# Patient Record
Sex: Female | Born: 1970 | Race: White | Hispanic: Yes | Marital: Married | Smoking: Never smoker
Health system: Southern US, Community
[De-identification: ages and names within clinical notes are randomized; demographics above are authoritative.]

## PROBLEM LIST (undated history)

## (undated) DIAGNOSIS — D693 Immune thrombocytopenic purpura: Secondary | ICD-10-CM

## (undated) DIAGNOSIS — D509 Iron deficiency anemia, unspecified: Secondary | ICD-10-CM

## (undated) DIAGNOSIS — E119 Type 2 diabetes mellitus without complications: Secondary | ICD-10-CM

## (undated) DIAGNOSIS — K219 Gastro-esophageal reflux disease without esophagitis: Secondary | ICD-10-CM

## (undated) DIAGNOSIS — E559 Vitamin D deficiency, unspecified: Secondary | ICD-10-CM

## (undated) HISTORY — DX: Type 2 diabetes mellitus without complications: E11.9

## (undated) HISTORY — DX: Immune thrombocytopenic purpura: D69.3

## (undated) HISTORY — DX: Iron deficiency anemia, unspecified: D50.9

## (undated) HISTORY — DX: Vitamin D deficiency, unspecified: E55.9

## (undated) HISTORY — DX: Gastro-esophageal reflux disease without esophagitis: K21.9

---

## 1998-10-15 ENCOUNTER — Ambulatory Visit (HOSPITAL_COMMUNITY): Admission: AD | Admit: 1998-10-15 | Discharge: 1998-10-15 | Payer: Self-pay | Admitting: Obstetrics & Gynecology

## 1998-11-17 ENCOUNTER — Ambulatory Visit (HOSPITAL_COMMUNITY): Admission: RE | Admit: 1998-11-17 | Discharge: 1998-11-17 | Payer: Self-pay | Admitting: *Deleted

## 1999-05-06 ENCOUNTER — Encounter: Payer: Self-pay | Admitting: Obstetrics & Gynecology

## 1999-05-06 ENCOUNTER — Inpatient Hospital Stay (HOSPITAL_COMMUNITY): Admission: AD | Admit: 1999-05-06 | Discharge: 1999-05-06 | Payer: Self-pay | Admitting: Obstetrics & Gynecology

## 1999-05-09 ENCOUNTER — Encounter: Payer: Self-pay | Admitting: Obstetrics

## 1999-05-09 ENCOUNTER — Encounter (INDEPENDENT_AMBULATORY_CARE_PROVIDER_SITE_OTHER): Payer: Self-pay | Admitting: Specialist

## 1999-05-09 ENCOUNTER — Inpatient Hospital Stay (HOSPITAL_COMMUNITY): Admission: AD | Admit: 1999-05-09 | Discharge: 1999-05-12 | Payer: Self-pay | Admitting: Obstetrics

## 2004-09-26 HISTORY — PX: TOTAL ABDOMINAL HYSTERECTOMY: SHX209

## 2005-02-09 ENCOUNTER — Inpatient Hospital Stay (HOSPITAL_COMMUNITY): Admission: RE | Admit: 2005-02-09 | Discharge: 2005-02-11 | Payer: Self-pay | Admitting: Gynecology

## 2005-02-09 ENCOUNTER — Encounter (INDEPENDENT_AMBULATORY_CARE_PROVIDER_SITE_OTHER): Payer: Self-pay | Admitting: *Deleted

## 2005-02-15 ENCOUNTER — Ambulatory Visit: Payer: Self-pay | Admitting: Oncology

## 2005-04-05 ENCOUNTER — Ambulatory Visit: Payer: Self-pay | Admitting: Oncology

## 2005-05-23 ENCOUNTER — Ambulatory Visit: Payer: Self-pay | Admitting: Oncology

## 2005-07-18 ENCOUNTER — Ambulatory Visit: Payer: Self-pay | Admitting: Oncology

## 2005-09-07 ENCOUNTER — Ambulatory Visit: Payer: Self-pay | Admitting: Oncology

## 2005-12-05 ENCOUNTER — Ambulatory Visit: Payer: Self-pay | Admitting: Oncology

## 2006-01-25 ENCOUNTER — Other Ambulatory Visit: Admission: RE | Admit: 2006-01-25 | Discharge: 2006-01-25 | Payer: Self-pay | Admitting: Gynecology

## 2006-01-27 ENCOUNTER — Ambulatory Visit: Payer: Self-pay | Admitting: Oncology

## 2006-01-31 LAB — CBC WITH DIFFERENTIAL/PLATELET
BASO%: 0.4 % (ref 0.0–2.0)
Basophils Absolute: 0 10*3/uL (ref 0.0–0.1)
EOS%: 0.6 % (ref 0.0–7.0)
HCT: 44.1 % (ref 34.8–46.6)
HGB: 15.2 g/dL (ref 11.6–15.9)
LYMPH%: 34.8 % (ref 14.0–48.0)
MCH: 30.3 pg (ref 26.0–34.0)
MCHC: 34.4 g/dL (ref 32.0–36.0)
MCV: 88 fL (ref 81.0–101.0)
MONO%: 7.1 % (ref 0.0–13.0)
NEUT%: 57.1 % (ref 39.6–76.8)
lymph#: 2.4 10*3/uL (ref 0.9–3.3)

## 2006-02-28 LAB — CBC WITH DIFFERENTIAL/PLATELET
BASO%: 0.7 % (ref 0.0–2.0)
Basophils Absolute: 0.1 10*3/uL (ref 0.0–0.1)
EOS%: 0.5 % (ref 0.0–7.0)
HCT: 44.7 % (ref 34.8–46.6)
HGB: 15.7 g/dL (ref 11.6–15.9)
LYMPH%: 34.8 % (ref 14.0–48.0)
MCH: 30.9 pg (ref 26.0–34.0)
MCHC: 35 g/dL (ref 32.0–36.0)
MONO#: 0.6 10*3/uL (ref 0.1–0.9)
NEUT%: 56.4 % (ref 39.6–76.8)
Platelets: 37 10*3/uL — ABNORMAL LOW (ref 145–400)

## 2006-05-24 ENCOUNTER — Ambulatory Visit: Payer: Self-pay | Admitting: Oncology

## 2006-05-26 LAB — CBC WITH DIFFERENTIAL/PLATELET
Eosinophils Absolute: 0.1 10*3/uL (ref 0.0–0.5)
HCT: 41.9 % (ref 34.8–46.6)
HGB: 14.6 g/dL (ref 11.6–15.9)
LYMPH%: 40.5 % (ref 14.0–48.0)
MONO#: 0.5 10*3/uL (ref 0.1–0.9)
NEUT#: 3.5 10*3/uL (ref 1.5–6.5)
NEUT%: 50.6 % (ref 39.6–76.8)
Platelets: 57 10*3/uL — ABNORMAL LOW (ref 145–400)
WBC: 6.8 10*3/uL (ref 3.9–10.0)
lymph#: 2.8 10*3/uL (ref 0.9–3.3)

## 2006-08-23 ENCOUNTER — Ambulatory Visit: Payer: Self-pay | Admitting: Oncology

## 2006-08-25 LAB — CBC WITH DIFFERENTIAL/PLATELET
Basophils Absolute: 0 10*3/uL (ref 0.0–0.1)
EOS%: 0.4 % (ref 0.0–7.0)
LYMPH%: 33.3 % (ref 14.0–48.0)
MCH: 31.3 pg (ref 26.0–34.0)
MCV: 89 fL (ref 81.0–101.0)
MONO%: 7 % (ref 0.0–13.0)
Platelets: 83 10*3/uL — ABNORMAL LOW (ref 145–400)
RBC: 4.95 10*6/uL (ref 3.70–5.32)
RDW: 13.1 % (ref 11.3–14.5)

## 2006-11-21 ENCOUNTER — Ambulatory Visit: Payer: Self-pay | Admitting: Oncology

## 2006-11-27 LAB — CBC WITH DIFFERENTIAL/PLATELET
BASO%: 0.5 % (ref 0.0–2.0)
Basophils Absolute: 0 10*3/uL (ref 0.0–0.1)
EOS%: 0.6 % (ref 0.0–7.0)
HGB: 14.8 g/dL (ref 11.6–15.9)
MCH: 31.3 pg (ref 26.0–34.0)
MCHC: 36.1 g/dL — ABNORMAL HIGH (ref 32.0–36.0)
MCV: 86.7 fL (ref 81.0–101.0)
MONO%: 6.6 % (ref 0.0–13.0)
RBC: 4.73 10*6/uL (ref 3.70–5.32)
RDW: 12.9 % (ref 11.3–14.5)
lymph#: 2.5 10*3/uL (ref 0.9–3.3)

## 2007-02-14 ENCOUNTER — Other Ambulatory Visit: Admission: RE | Admit: 2007-02-14 | Discharge: 2007-02-14 | Payer: Self-pay | Admitting: Gynecology

## 2007-02-21 ENCOUNTER — Ambulatory Visit: Payer: Self-pay | Admitting: Oncology

## 2007-02-23 LAB — CBC WITH DIFFERENTIAL/PLATELET
Basophils Absolute: 0 10*3/uL (ref 0.0–0.1)
Eosinophils Absolute: 0 10*3/uL (ref 0.0–0.5)
HGB: 14.5 g/dL (ref 11.6–15.9)
MCV: 87.2 fL (ref 81.0–101.0)
MONO%: 8.1 % (ref 0.0–13.0)
NEUT#: 3.1 10*3/uL (ref 1.5–6.5)
RBC: 4.64 10*6/uL (ref 3.70–5.32)
RDW: 12.8 % (ref 11.3–14.5)
WBC: 6.5 10*3/uL (ref 3.9–10.0)
lymph#: 2.8 10*3/uL (ref 0.9–3.3)

## 2007-02-27 ENCOUNTER — Ambulatory Visit (HOSPITAL_COMMUNITY): Admission: RE | Admit: 2007-02-27 | Discharge: 2007-02-27 | Payer: Self-pay | Admitting: Gynecology

## 2007-03-12 LAB — CBC WITH DIFFERENTIAL/PLATELET
Basophils Absolute: 0 10*3/uL (ref 0.0–0.1)
Eosinophils Absolute: 0.1 10*3/uL (ref 0.0–0.5)
HCT: 41.4 % (ref 34.8–46.6)
HGB: 14.8 g/dL (ref 11.6–15.9)
MONO#: 0.5 10*3/uL (ref 0.1–0.9)
NEUT%: 55.5 % (ref 39.6–76.8)
WBC: 7.5 10*3/uL (ref 3.9–10.0)
lymph#: 2.7 10*3/uL (ref 0.9–3.3)

## 2007-09-06 ENCOUNTER — Ambulatory Visit: Payer: Self-pay | Admitting: Oncology

## 2007-10-31 ENCOUNTER — Ambulatory Visit: Payer: Self-pay | Admitting: Oncology

## 2007-11-02 LAB — CBC WITH DIFFERENTIAL/PLATELET
Basophils Absolute: 0 10*3/uL (ref 0.0–0.1)
EOS%: 1.3 % (ref 0.0–7.0)
HCT: 43.5 % (ref 34.8–46.6)
HGB: 15.4 g/dL (ref 11.6–15.9)
MCH: 31.4 pg (ref 26.0–34.0)
MCV: 88.4 fL (ref 81.0–101.0)
MONO%: 7 % (ref 0.0–13.0)
NEUT%: 51.5 % (ref 39.6–76.8)
Platelets: 79 10*3/uL — ABNORMAL LOW (ref 145–400)

## 2008-03-21 ENCOUNTER — Other Ambulatory Visit: Admission: RE | Admit: 2008-03-21 | Discharge: 2008-03-21 | Payer: Self-pay | Admitting: Gynecology

## 2008-06-05 ENCOUNTER — Ambulatory Visit: Payer: Self-pay | Admitting: Oncology

## 2008-06-10 LAB — CBC WITH DIFFERENTIAL/PLATELET
Basophils Absolute: 0 10*3/uL (ref 0.0–0.1)
Eosinophils Absolute: 0 10*3/uL (ref 0.0–0.5)
HCT: 42.7 % (ref 34.8–46.6)
HGB: 15.2 g/dL (ref 11.6–15.9)
MCH: 31.9 pg (ref 26.0–34.0)
MONO#: 0.4 10*3/uL (ref 0.1–0.9)
NEUT%: 51.9 % (ref 39.6–76.8)
WBC: 5.8 10*3/uL (ref 3.9–10.0)
lymph#: 2.3 10*3/uL (ref 0.9–3.3)

## 2008-10-07 ENCOUNTER — Ambulatory Visit: Payer: Self-pay | Admitting: Oncology

## 2009-02-04 ENCOUNTER — Ambulatory Visit: Payer: Self-pay | Admitting: Oncology

## 2009-02-06 LAB — CBC WITH DIFFERENTIAL/PLATELET
BASO%: 0.9 % (ref 0.0–2.0)
HCT: 41.2 % (ref 34.8–46.6)
MCHC: 34.7 g/dL (ref 31.5–36.0)
MONO#: 0.4 10*3/uL (ref 0.1–0.9)
NEUT%: 52.3 % (ref 38.4–76.8)
WBC: 5.4 10*3/uL (ref 3.9–10.3)
lymph#: 2.1 10*3/uL (ref 0.9–3.3)

## 2009-03-27 ENCOUNTER — Ambulatory Visit: Payer: Self-pay | Admitting: Gynecology

## 2009-03-27 ENCOUNTER — Encounter: Payer: Self-pay | Admitting: Gynecology

## 2009-03-27 ENCOUNTER — Other Ambulatory Visit: Admission: RE | Admit: 2009-03-27 | Discharge: 2009-03-27 | Payer: Self-pay | Admitting: Gynecology

## 2009-07-30 ENCOUNTER — Ambulatory Visit: Payer: Self-pay | Admitting: Oncology

## 2010-02-03 ENCOUNTER — Ambulatory Visit: Payer: Self-pay | Admitting: Oncology

## 2010-02-04 LAB — CBC WITH DIFFERENTIAL/PLATELET
BASO%: 0.5 % (ref 0.0–2.0)
Basophils Absolute: 0 10*3/uL (ref 0.0–0.1)
EOS%: 0.5 % (ref 0.0–7.0)
Eosinophils Absolute: 0 10*3/uL (ref 0.0–0.5)
HCT: 42.7 % (ref 34.8–46.6)
HGB: 14.8 g/dL (ref 11.6–15.9)
LYMPH%: 39.6 % (ref 14.0–49.7)
MCH: 31.2 pg (ref 25.1–34.0)
MCHC: 34.6 g/dL (ref 31.5–36.0)
MCV: 90.2 fL (ref 79.5–101.0)
MONO#: 0.5 10*3/uL (ref 0.1–0.9)
MONO%: 8.6 % (ref 0.0–14.0)
NEUT#: 3 10*3/uL (ref 1.5–6.5)
NEUT%: 50.8 % (ref 38.4–76.8)
Platelets: 114 10*3/uL — ABNORMAL LOW (ref 145–400)
RBC: 4.73 10*6/uL (ref 3.70–5.45)
RDW: 12.6 % (ref 11.2–14.5)
WBC: 5.9 10*3/uL (ref 3.9–10.3)
lymph#: 2.3 10*3/uL (ref 0.9–3.3)

## 2010-06-03 ENCOUNTER — Ambulatory Visit: Payer: Self-pay | Admitting: Gynecology

## 2010-06-03 ENCOUNTER — Other Ambulatory Visit: Admission: RE | Admit: 2010-06-03 | Discharge: 2010-06-03 | Payer: Self-pay | Admitting: Gynecology

## 2010-08-03 ENCOUNTER — Ambulatory Visit: Payer: Self-pay | Admitting: Oncology

## 2011-02-03 ENCOUNTER — Other Ambulatory Visit: Payer: Self-pay | Admitting: Oncology

## 2011-02-03 ENCOUNTER — Encounter (HOSPITAL_BASED_OUTPATIENT_CLINIC_OR_DEPARTMENT_OTHER): Payer: Self-pay | Admitting: Oncology

## 2011-02-03 DIAGNOSIS — D693 Immune thrombocytopenic purpura: Secondary | ICD-10-CM

## 2011-02-03 LAB — CBC WITH DIFFERENTIAL/PLATELET
BASO%: 0.5 % (ref 0.0–2.0)
Basophils Absolute: 0 10*3/uL (ref 0.0–0.1)
EOS%: 0.6 % (ref 0.0–7.0)
Eosinophils Absolute: 0 10*3/uL (ref 0.0–0.5)
HCT: 40.9 % (ref 34.8–46.6)
HGB: 14.2 g/dL (ref 11.6–15.9)
LYMPH%: 32 % (ref 14.0–49.7)
MCH: 31.5 pg (ref 25.1–34.0)
MCHC: 34.7 g/dL (ref 31.5–36.0)
MCV: 90.8 fL (ref 79.5–101.0)
MONO#: 0.5 10*3/uL (ref 0.1–0.9)
MONO%: 9 % (ref 0.0–14.0)
NEUT#: 3.4 10*3/uL (ref 1.5–6.5)
NEUT%: 57.9 % (ref 38.4–76.8)
Platelets: 116 10*3/uL — ABNORMAL LOW (ref 145–400)
RBC: 4.5 10*6/uL (ref 3.70–5.45)
RDW: 13.3 % (ref 11.2–14.5)
WBC: 5.9 10*3/uL (ref 3.9–10.3)
lymph#: 1.9 10*3/uL (ref 0.9–3.3)

## 2011-02-11 NOTE — Op Note (Signed)
NAMEMATISON, NUCCIO           ACCOUNT NO.:  [id number]   MEDICAL RECORD NO.:  [id number]          PATIENT TYPE:  INP   LOCATION:  9313                          FACILITY:  [facility]   PHYSICIAN:  Juan H. Lily Peer, M.D.DATE OF BIRTH:  1971-04-07   DATE OF PROCEDURE:  02/09/2005  DATE OF DISCHARGE:                                 OPERATIVE REPORT   PREOPERATIVE DIAGNOSES:  Leiomyoma uteri.   POSTOPERATIVE DIAGNOSIS:  Leiomyoma uteri.   ANESTHESIA:  General.   PROCEDURES PERFORMED:  1.  Exploratory laparotomy.  2.  Lysis of pelvic adhesions.  3.  Total abdominal hysterectomy.  4.  Biopsy of the right ovary.   SURGEON:  Juan H. Lily Peer, M.D.   FIRST ASSISTANT:  Ivor Costa. Farrel Gobble, M.D.   INDICATION FOR OPERATION:  A 40 year old gravida 4, para 4, with a suspected  leiomyomatous uterus.   FINDINGS:  Patient with an apparent cervical leiomyoma and a small right  ovarian cyst that appeared to be more like an endometrioma.  Otherwise the  ovaries were normal, the tubes were normal, and no other abnormality of the  peritoneal surface was noted and the appendix was normal.   DESCRIPTION OF OPERATION:  After the patient was adequately counseled, she  was taken to the operating room, where she underwent a successful general  endotracheal anesthesia and she had received 1 g of Cefotetan for  prophylaxis.  She has PSA stockings for DVT prophylaxis.  The abdomen had  been prepped and draped in the usual sterile fashion, a Foley catheter had  been inserted in an effort to monitor urinary output.  A Pfannenstiel skin  incision was made approximately 2 cm above the symphysis pubis.  The  incision was carried down through the skin and subcutaneous tissue down to  the rectus fascia, whereby a midline nick was made.  The fascia was incised  in a transverse fashion, the peritoneal cavity was entered.  An Lenox Ahr retractor was used for exposure.  The patient was placed in a  slight Trendelenburg.  It was at this time that the large cervical myoma was  noted and the uterus was placed under traction and both triple pedicles  incorporating the utero-ovarian ligament and the tube and round ligament  were clamped proximal to the uterus to obtain traction.  The right round  ligament was identified and was suture ligated with 0 Vicryl suture and was  transected, and the anterior bladder flap was established.  The posterior  broad ligament was penetrated with the surgeon's finger after clearly  identifying the right ureter to be completely away, and a Heaney clamp was  utilized to clamp the utero-ovarian ligament as well as the round ligament  had been transected and was cut and suture ligated with 0 Vicryl suture,  followed by a transfixion stitch, thus by leaving the right tube and ovary  behind.  A similar procedure was carried out on the contralateral side.  The  broad ligament and cardinal ligaments were serially clamped, cut, and suture  ligated with 0 Vicryl suture.  Once the uterine arteries were clamped and  suture  ligated with 0 Vicryl suture, the leiomyoma was enucleated in an  effort to attain better exposure to the lower uterine segment.  Once this  was accomplished, the remainder of the cervix was clamped, cut, and suture  ligated with 0 Vicryl suture and the cervix was submitted off the operative  field, as was the uterus.  The angles were secured with 0 Vicryl in a  transfixion stitch, and the remaining pelvis was closed with an interrupted  suture of 0 Vicryl suture.  Three specimens were submitted.  The first one  was the leiomyoma, the second one was the uterus, the third one was the  remaining cervical stump.  The pelvic cavity was then copiously irrigated  with normal saline solution.  Both ovaries were suspended to the round  ligament (oophoropexy).  Attention was then placed to the right ovary.  It  had a small cyst.  There was question of whether  it was a dermoid.  It was  incised and the contents was submitted for histologic evaluation and a  figure-of-eight of 3-0 Vicryl suture was used to close the capsule.  Surgicel was placed in the vaginal cuff area on the raw surfaces after the  pelvic cavity was copiously irrigated with normal saline solution.  The  sponge count and needle count were correct.  The visceral peritoneum was  closed with a running stitch of 3-0 Vicryl suture, and the rectus fascia was  closed with a running stitch of 0 Vicryl suture, and the subcutaneous  bleeders were Bovie cauterized.  The skin was reapproximated with skin  clips, followed by placement of Xeroform gauze and a 4 x 8 dressing.  The  patient was extubated and transferred to the recovery room with stable vital  signs.  Blood loss for the procedure was 400 mL, urine output 250 mL, IV  fluids were 1500 mL of lactated Ringer's.  The specimen weighed 4 pounds 6  ounces.      JHF/MEDQ  D:  02/11/2005  T:  02/11/2005  Job:  [id number]

## 2011-02-11 NOTE — Discharge Summary (Signed)
Jenna Montoya, Jenna Montoya           ACCOUNT NO.:  [id number]   MEDICAL RECORD NO.:  [id number]          PATIENT TYPE:  INP   LOCATION:  9313                          FACILITY:  [facility]   PHYSICIAN:  Juan H. Lily Peer, M.D.DATE OF BIRTH:  [birth year]   DATE OF ADMISSION:  02/09/2005  DATE OF DISCHARGE:                                 DISCHARGE SUMMARY   Total days hospitalized:  2.   HISTORY:  The patient is a 40 year old gravida 4 para 4 that was taken to  the operating room the morning of Feb 11, 2005 secondary to a pelvic mass  suspicious for a leiomyomatous uteri. The patient underwent a total  abdominal hysterectomy and there was a right ovarian biopsy that was made,  and she had lysis of pelvic adhesions. The patient did well  intraoperatively. She had a blood loss of 400 mL. Of interest, her  preoperative hemoglobin and hematocrit were 13.8 and 40.8 and her platelet  count was __________. On her postoperative day #1 her hemoglobin was 8.9;  hematocrit 26.1; platelet count was 50,000. The patient had denied any  history of petechiae, any bleeding disorders of any sort in her or her  family, and we will make arrangements for her to follow up with a  hematologist/oncologist for further evaluation for possible underlying ITP.  The patient on postoperative day #1 had her Foley catheter removed as well  as her PCA pump. She was started on a clear liquid diet. By her  postoperative day #2 she was advanced to a full regular diet. She was  tolerating a regular diet well, was ambulating and showered, and was ready  to be discharged home and to be followed up to have her staples removed.   DIAGNOSES:  1.  Idiopathic thrombocytopenic purpura.  2.  Leiomyomatous uteri.  3.  Right ovarian cyst.  4.  Pelvic adhesions.   PROCEDURE PERFORMED:  1.  Exploratory laparotomy.  2.  Pelvic adhesiolysis.  3.  Total abdominal hysterectomy.  4.  Right ovarian biopsy.   FINAL DISPOSITION AND FOLLOW-UP:   The patient was discharged home on her  postoperative day #2. She was to return back to the office next week to have  her staples removed. She will be started on supplemental iron one p.o. daily  and was given a prescription for Lortab 7.5/500 to take one p.o. q.4-6h.  p.r.n. pain, and a prescription also for Reglan 10 mg to take one p.o. q.4-  6h. p.r.n. nausea. The patient will be given the scheduled appointment to  see  the hematologist/oncologist when she comes to the office next week to have  her staples removed. Discharge instructions were provided in written form  and were also discussed in Spanish. All questions were answered and will  follow accordingly.      JHF/MEDQ  D:  02/11/2005  T:  02/11/2005  Job:  [id number]

## 2011-02-11 NOTE — H&P (Signed)
Jenna Montoya, Jenna Montoya                ACCOUNT NO.:  [id number]   MEDICAL RECORD NO.:  [id number]           PATIENT TYPE:   LOCATION:                                 FACILITY:   PHYSICIAN:  Juan H. Lily Peer, M.D.     DATE OF BIRTH:   DATE OF ADMISSION:  02/09/2005  DATE OF DISCHARGE:                                HISTORY & PHYSICAL   CHIEF COMPLAINT:  Pelvic mass.   HISTORY:  The patient is a 40 year old, gravida 4, para 4, who had been  referred to our office on December 13, 2004, through courtesy of Dr. Thomasene Mohair in reference to incidental findings at the time of annual  examination.  The patient was found to have a pelvic mass.  The patient had  been complaining of lightheadedness at times, and anemia, and heavy periods.  An ultrasound had been done, at [facility], which demonstrated there  was a fundal fibroid measuring 9-cm slight to the right of the patient's  midline.  Both ovaries were noted to be normal as well as a normal  endometrial stripe based on the patient's age.  The patient had a CBC and  was determined to be anemic and was started on supplemental iron since her  hemoglobin was 11.3 and 35.3 respectively.  Anemia workup had demonstrated a  low serum iron as well as a low saturation, high total iron-binding  capacity.  On examination, it was confirmed the patient had a enlarged  uterus and the fundus where the fibroid went up the level underneath the  umbilicus.  The patient had decided to proceed with preventative surgery but  with ovarian conservation of her ongoing abdominal pressure, menorrhagia,  and iron-deficiency anemia.   PAST MEDICAL HISTORY:  1.  The patient has had three normal spontaneous vaginal deliveries.  2.  One cesarean section.  3.  Bilateral tubal sterilization procedure.   MEDICATIONS:  1.  Iron supplementation for anemia.  2.  Imitrex for her migraine headaches.   The patient denies any allergies.   FAMILY HISTORY:   Negative.   PHYSICAL EXAMINATION:  VITAL SIGNS:  The patient weighs 132 pounds.  Her  blood pressure was 102/78.  HEENT:  Unremarkable.  NECK:  Supple.  Trachea midline.  No carotid bruits.  No thyromegaly.  LUNGS:  Clear to auscultation without rhonchi or wheezes.  HEART:  Regular rate and rhythm without any murmurs or gallops.  BREAST:  Unremarkable.  ABDOMEN:  Soft nontender without rebound or guarding.  PELVIC:  Bartholin, urethral, and [name] glands within normal limits.  Vagina  and cervix with no gross lesions or discharge.  The uterus is slightly  irregular shape with a fundal __________  extending up to 2- to -3-cm below  the umbilicus.  The adnexa was unable to be assessed due to the size of the  fibroid.  It was confirmatory on rectal examination.   ASSESSMENT:  A 40 year old, gravida 4, para 4, with a large subserosal  leiomyoma causing the patient to suffer from iron-deficiency anemia as well  as bloating and pressure.  The patient was counseled as to risks, benefits, and pros and cons of  hysterectomy to include the following, the risk of hemorrhage and if she  were to receive blood or blood products she is fully aware of the potential  risks of anaphylactic reaction, hepatitis, and AIDs, also in the event of  trauma to internal organs need for corrective surgery at that time such as  blood vessels, bladder, intestines, or nerves were discussed, also there is  a risk of deep vein thrombosis and subsequent pulmonary embolism even death.  The patient will have PSA stockings to prevent such occurrence, and also the  risk of infection for which she will receive prophylaxis, IV antibiotics as  well.  All measures will be taken to leave the ovaries and she is 33-years-  of-age.  Unless __________  to remove one or both ovaries for some surgical  indication may need to be removed.  The patient is fully aware that she may  need to be placed on hormone replacement therapy.  All  these issues were  discussed with the patient.  All questions were answered in Spanish and we  will plan accordingly.   PLAN:  The patient is scheduled for a total abdominal hysterectomy on  Wednesday, Feb 09, 2005 at 8:30 a.m.      JHF/MEDQ  D:  02/08/2005  T:  02/08/2005  Job:  [id number]

## 2011-08-04 ENCOUNTER — Other Ambulatory Visit (HOSPITAL_BASED_OUTPATIENT_CLINIC_OR_DEPARTMENT_OTHER): Payer: Self-pay | Admitting: Lab

## 2011-08-04 ENCOUNTER — Other Ambulatory Visit: Payer: Self-pay | Admitting: Oncology

## 2011-08-04 DIAGNOSIS — D693 Immune thrombocytopenic purpura: Secondary | ICD-10-CM

## 2011-08-04 LAB — CBC WITH DIFFERENTIAL/PLATELET
Basophils Absolute: 0 10*3/uL (ref 0.0–0.1)
EOS%: 0.7 % (ref 0.0–7.0)
HCT: 41.7 % (ref 34.8–46.6)
HGB: 14.6 g/dL (ref 11.6–15.9)
MCH: 31.8 pg (ref 25.1–34.0)
MCV: 90.9 fL (ref 79.5–101.0)
MONO%: 9 % (ref 0.0–14.0)
NEUT%: 49.3 % (ref 38.4–76.8)
lymph#: 2.4 10*3/uL (ref 0.9–3.3)

## 2011-08-12 ENCOUNTER — Telehealth: Payer: Self-pay | Admitting: *Deleted

## 2011-08-12 NOTE — Telephone Encounter (Addendum)
Left message on voicemail for pt.  Message copied by Caleb Popp on Fri Aug 12, 2011  1:38 PM ------      Message from: Ladene Artist      Created: Fri Aug 12, 2011 10:09 AM       Labs are ok, please call patient

## 2011-08-30 ENCOUNTER — Encounter: Payer: Self-pay | Admitting: Gynecology

## 2011-08-30 ENCOUNTER — Ambulatory Visit (INDEPENDENT_AMBULATORY_CARE_PROVIDER_SITE_OTHER): Payer: Self-pay | Admitting: Gynecology

## 2011-08-30 ENCOUNTER — Other Ambulatory Visit (HOSPITAL_COMMUNITY)
Admission: RE | Admit: 2011-08-30 | Discharge: 2011-08-30 | Disposition: A | Discharge status: Home / Self Care | Payer: Self-pay | Source: Ambulatory Visit | Attending: Gynecology | Admitting: Gynecology

## 2011-08-30 VITALS — BP 118/70 | Ht 60.25 in | Wt 130.0 lb

## 2011-08-30 DIAGNOSIS — Z01419 Encounter for gynecological examination (general) (routine) without abnormal findings: Secondary | ICD-10-CM

## 2011-08-30 DIAGNOSIS — R635 Abnormal weight gain: Secondary | ICD-10-CM

## 2011-08-30 DIAGNOSIS — D693 Immune thrombocytopenic purpura: Secondary | ICD-10-CM | POA: Insufficient documentation

## 2011-08-30 NOTE — Progress Notes (Signed)
Jenna Montoya 28-May-1971 [id number]   History:    39 y.o.  for annual exam with no complaints today. Review of her record indicated her last mammogram was in 2008. She does her monthly self breast examination. Patient with history of colon, hysterectomy secondary to leiomyomatous uteri and 2006. Atypical leiomyoma with no evidence of increased mitotic activity was reported by the pathologist. 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 Irish Lack. (hematologist oncologist). Patient had CBC in his office November 8 with a normal platelet count 152,000.  Past medical history,surgical history, family history and social history were all reviewed and documented in the EPIC chart.  Gynecologic History Patient's last menstrual period was 02/09/2005. Contraception: Hysterectomy Last Pap: 2011. Results were:normal} Last mammogram: 2008. Results were:normal}  Obstetric History OB History    Grav Para Term Preterm Abortions TAB SAB Ect Mult Living   4 4 4       4      # Outc Date GA Lbr Len/2nd Wgt Sex Del Anes PTL Lv   1 TRM     M SVD  No Yes   2 TRM     F SVD  No Yes   3 TRM     M SVD  No Yes   4 TRM     F CS  No Yes       ROS:  Was performed and pertinent positives and negatives are included in the history.  Exam: chaperone present  BP 118/70  Ht 5' 0.25" (1.53 m)  Wt 130 lb (58.968 kg)  BMI 25.18 kg/m2  LMP 02/09/2005  Body mass index is 25.18 kg/(m^2).  General appearance : Well developed well nourished female. No acute distress HEENT: Neck supple, trachea midline, no carotid bruits, no thyroidmegaly Lungs: Clear to auscultation, no rhonchi or wheezes, or rib retractions  Heart: Regular rate and rhythm, no murmurs or gallops Breast:Examined in sitting and supine position were symmetrical in appearance, no palpable masses or tenderness,  no skin retraction, no nipple inversion, no nipple discharge, no skin discoloration, no axillary or supraclavicular  lymphadenopathy Abdomen: no palpable masses or tenderness, no rebound or guarding Extremities: no edema or skin discoloration or tenderness  Pelvic:  Bartholin, Urethra, [name] Glands: Within normal limits             Vagina: No gross lesions or discharge  Cervix:  absent Uterus absent  Adnexa  Without             masses or tenderness  Anus and perineum  normal   Rectovaginal  normal sphincter tone without palpated             masses or tenderness             Hemoccult not done     Assessment/Plan:  40 y.o. female for annual exam with no abnormalities detected. Patient has done well since her surgery 2006. Her ITP stable. CBC would not be drawn today since it was just drawn recently. We will check a fasting lipid profile along with a fasting blood sugar urinalysis and Pap smear. She was given a requisition to schedule her mammogram. She was encouraged to continue monthly self breast examination. She was instructed to continue to take her calcium and vitamin D for osteoporosis prevention. We'll see her back in one year or when necessary. Patient declined flu vaccine.    Ok Edwards MD, 9:16 AM 08/30/2011

## 2011-09-05 ENCOUNTER — Telehealth: Payer: Self-pay | Admitting: *Deleted

## 2011-09-05 ENCOUNTER — Other Ambulatory Visit: Payer: Self-pay | Admitting: *Deleted

## 2011-09-05 DIAGNOSIS — E782 Mixed hyperlipidemia: Secondary | ICD-10-CM

## 2011-09-05 NOTE — Telephone Encounter (Signed)
Message copied by Libby Maw on Mon Sep 05, 2011  3:29 PM ------      Message from: Bertram Savin A      Created: Mon Sep 05, 2011 11:39 AM       PATIENT HAS BEEN INFORMED OF MESSAGE BELOW.Marland Kitchen WILL ROUTE TO Sparkle [facility]ube TO DO REFERRAL.Marland KitchenMarland Kitchen

## 2011-09-05 NOTE — Telephone Encounter (Signed)
Patient will be contacted by [facility] CMA to inform patient of appointment set with Dr. Daleen Squibb on 10/05/11 @10 :30am.

## 2011-10-05 ENCOUNTER — Institutional Professional Consult (permissible substitution): Payer: Self-pay | Admitting: Cardiology

## 2011-11-01 ENCOUNTER — Other Ambulatory Visit: Payer: Self-pay | Admitting: Gynecology

## 2011-11-01 DIAGNOSIS — Z1231 Encounter for screening mammogram for malignant neoplasm of breast: Secondary | ICD-10-CM

## 2011-11-23 ENCOUNTER — Ambulatory Visit: Payer: Self-pay

## 2011-11-29 ENCOUNTER — Encounter: Payer: Self-pay | Admitting: Gynecology

## 2011-12-27 ENCOUNTER — Encounter: Payer: Self-pay | Admitting: Gynecology

## 2012-01-27 ENCOUNTER — Telehealth: Payer: Self-pay | Admitting: Oncology

## 2012-01-27 NOTE — Telephone Encounter (Signed)
S/w pt re appt for 5/24

## 2012-02-17 ENCOUNTER — Ambulatory Visit (HOSPITAL_BASED_OUTPATIENT_CLINIC_OR_DEPARTMENT_OTHER): Payer: Self-pay | Admitting: Oncology

## 2012-02-17 ENCOUNTER — Other Ambulatory Visit (HOSPITAL_BASED_OUTPATIENT_CLINIC_OR_DEPARTMENT_OTHER): Payer: Self-pay | Admitting: Lab

## 2012-02-17 ENCOUNTER — Telehealth: Payer: Self-pay | Admitting: Oncology

## 2012-02-17 VITALS — BP 115/77 | HR 64 | Temp 97.6°F | Ht 60.25 in | Wt 121.3 lb

## 2012-02-17 DIAGNOSIS — D693 Immune thrombocytopenic purpura: Secondary | ICD-10-CM

## 2012-02-17 LAB — CBC WITH DIFFERENTIAL/PLATELET
Basophils Absolute: 0 10*3/uL (ref 0.0–0.1)
Eosinophils Absolute: 0 10*3/uL (ref 0.0–0.5)
HCT: 41.6 % (ref 34.8–46.6)
HGB: 14.3 g/dL (ref 11.6–15.9)
LYMPH%: 38.8 % (ref 14.0–49.7)
MCV: 91.2 fL (ref 79.5–101.0)
MONO%: 7.6 % (ref 0.0–14.0)
NEUT#: 3.1 10*3/uL (ref 1.5–6.5)
NEUT%: 52.6 % (ref 38.4–76.8)
Platelets: 120 10*3/uL — ABNORMAL LOW (ref 145–400)

## 2012-02-17 NOTE — Progress Notes (Signed)
   [facility]    OFFICE PROGRESS NOTE   INTERVAL HISTORY:   She returns as scheduled. She reports easy bruising. No other bleeding.  There is a chronic "rash "at the upper arm. She has seen a dermatologist. Her daughter has a similar rash.  Objective:  Vital signs in last 24 hours:  Blood pressure 115/77, pulse 64, temperature 97.6 F (36.4 C), temperature source Oral, height 5' 0.25" (1.53 m), weight 121 lb 4.8 oz (55.021 kg), last menstrual period 02/09/2005.    HEENT: Oropharynx without thrush or bleeding Lymphatics: No cervical, supraclavicular, or axillary nodes Resp: Lungs clear bilaterally Cardio: Regular rate and rhythm GI: No hepatomegaly Vascular: No leg edema  Skin: Scattered small ecchymoses over the extremities in various stages of healing. Brown/Grady plaque-like slightly raised rash over the upper arm bilaterally    Lab Results:  Lab Results  Component Value Date   WBC 5.8 02/17/2012   HGB 14.3 02/17/2012   HCT 41.6 02/17/2012   MCV 91.2 02/17/2012   PLT 120* 02/17/2012   ANC 3.1 Platelets 152 on 08/04/2011, 116 on 02/03/2011    Medications: I have reviewed the patient's current medications.  Assessment/Plan: 1. Chronic ITP.  She has stable mild thrombocytopenia. 2. History of iron-deficiency anemia, resolved. 3. Skin rash at the upper arms-? Eczema, she will followup with her dermatologist as needed   Disposition:  She is stable from a hematologic standpoint. She knows to contact us for increased bruising or bleeding.  Ms. Jenna Montoya will return for a CBC in 6 months. She is scheduled for a one-year office visit.   Jenna Papas, MD  02/17/2012  2:28 PM

## 2012-02-17 NOTE — Telephone Encounter (Signed)
Gave pt appt calendar for November 2013 and see ML and lab in May 2014

## 2012-08-14 ENCOUNTER — Other Ambulatory Visit: Payer: Self-pay

## 2012-09-26 HISTORY — PX: UPPER GASTROINTESTINAL ENDOSCOPY: SHX188

## 2012-10-18 ENCOUNTER — Ambulatory Visit (INDEPENDENT_AMBULATORY_CARE_PROVIDER_SITE_OTHER): Payer: Self-pay | Admitting: Gynecology

## 2012-10-18 ENCOUNTER — Encounter: Payer: Self-pay | Admitting: Gynecology

## 2012-10-18 ENCOUNTER — Other Ambulatory Visit (HOSPITAL_COMMUNITY)
Admission: RE | Admit: 2012-10-18 | Discharge: 2012-10-18 | Disposition: A | Discharge status: Home / Self Care | Payer: Self-pay | Source: Ambulatory Visit | Attending: Gynecology | Admitting: Gynecology

## 2012-10-18 VITALS — BP 120/88 | Ht 59.75 in | Wt 123.0 lb

## 2012-10-18 DIAGNOSIS — D693 Immune thrombocytopenic purpura: Secondary | ICD-10-CM

## 2012-10-18 DIAGNOSIS — D649 Anemia, unspecified: Secondary | ICD-10-CM

## 2012-10-18 DIAGNOSIS — Z01419 Encounter for gynecological examination (general) (routine) without abnormal findings: Secondary | ICD-10-CM | POA: Insufficient documentation

## 2012-10-18 DIAGNOSIS — R1013 Epigastric pain: Secondary | ICD-10-CM

## 2012-10-18 DIAGNOSIS — Z1151 Encounter for screening for human papillomavirus (HPV): Secondary | ICD-10-CM | POA: Insufficient documentation

## 2012-10-18 LAB — CBC WITH DIFFERENTIAL/PLATELET
Basophils Absolute: 0 10*3/uL (ref 0.0–0.1)
Basophils Relative: 1 % (ref 0–1)
Eosinophils Relative: 1 % (ref 0–5)
HCT: 41.8 % (ref 36.0–46.0)
MCHC: 34.7 g/dL (ref 30.0–36.0)
MCV: 89.3 fL (ref 78.0–100.0)
Monocytes Absolute: 0.5 10*3/uL (ref 0.1–1.0)
Monocytes Relative: 7 % (ref 3–12)
RDW: 13.6 % (ref 11.5–15.5)

## 2012-10-18 NOTE — Patient Instructions (Addendum)
Clculos del conducto biliar comn  (Common Bile Duct Stones)  La vescula biliar se encuentra en el lado derecho de la parte superior del vientre (abdomen), debajo del hgado. [place] la bilis del hgado hasta que la necesite. La bilis se compone de colesterol, agua, sales, grasas, protenas, y producto de desecho (bilirubina). El conducto biliar comn es el tubo que transporta la bilis desde otros conductos hacia el intestino delgado para ayudar a Location manager las grasas. Los clculos del conducto biliar son piezas similares a piedra, que se forman en la vescula biliar y se alojan en el conducto biliar comn. Los clculos pueden estar en la vescula biliar y tambin en el conducto biliar. Pueden variar en tamao y nmero. Tener una piedra en el conducto biliar comn puede ser un problema grave. La piedra se puede eliminarse sin tratamiento. Sin embargo, si se [place] y provoca una obstruccin en el conducto biliar comn, la piel o los ojos pueden ponerse amarillos (ictericia). Si la obstruccin persiste, la bilis puede infectarse (colangitis). Esta es una enfermedad que pone en peligro la vida.  CAUSAS  El exceso de colesterol en la bilis es la causa ms frecuente para que la bilis se endurezca y forme un material similar a la piedra. El exceso de bilirrubina o no tener suficientes sales biliares tambin pueden hacer que la bilis se endurezca. No se sabe bien porqu esto ocurre.  Ciertos factores aumentan el riesgo de formar piedras, como:   Ser [place]. Las mujeres son dos veces ms propensas a Environmental education officer clculos biliares, especialmente aquellas que estn embarazadas, reciben terapia de reemplazo hormonal, o toman pldoras anticonceptivas.  Tener antecedentes familiares de clculos.  El exceso de peso (incluso un sobrepeso moderado).  El consumo de dieta rica en grasas y en colesterol.  Dieta rpidas o prdida de peso rpida.  Ser mayor de 60 aos.  Ser descendiente de indios  norteamericanos o mexicanos.  Tomar medicamentos para reducir Print production planner.  Tener diabetes. SNTOMAS  Los clculos pequeos en los conductos biliares pueden no causar ningn sntoma. A veces se descubren en el momento de hacer pruebas para otras enfermedades. Los clculos ms grandes pueden obstruir el flujo de la bilis hacia el intestino delgado. Esto puede causar infeccin e inflamacin en la vescula biliar, los conductos, o a veces, el hgado o el pncreas. Los sntomas (a veces llamados "ataque de la vescula biliar") pueden incluir:   Dolor en la zona del abdomen superior derecho (clico biliar). El dolor puede durar desde 30 minutos hasta varias horas.  Puede sentirse en los omplatos, en la espalda.  Dolor debajo del hombro derecho.  Nuseas.  Vmitos.  Grant Ruts.  [place]ct[place]. DIAGNSTICO  Los sntomas pueden imitar otras enfermedades, por lo tanto es importante realizar un diagnstico correcto. Generalmente se realizan diagnsticos por imgees para confirmar la sospecha y Production assistant, radio ubicacin y el tamao de las piedras, por ejemplo:   Regulatory affairs officer.  Tomografa computada.  Colecistogammagrafa, o HIDA (cido iminodiactico hepatobiliar). Esta es una tcnica por imgenes que utiliza una tintura radiactiva segura.  CPRE (colangiopancreatografia retrgrada endoscpica). Se trata de una tcnica por imgenes que puede detectar y eliminar las piedras. Es posible que se realicen anlisis de [place].  TRATAMIENTO  Si no tiene sntomas, se har una observacin. Si usted experimentando dolor u otros sntomas:   Le recetarn analgsicos y antibiticos.  La CPRE se puede realizar para Medical laboratory scientific officer y Pharmacologist las piedras para Technical sales engineer obstruccin. En este procedimiento, un tubo se inserta a travs de la  boca, hacia abajo en el intestino delgado. Despus de la CPRE, la vescula biliar con frecuencia se retira (colecistectoma), durante la misma hospitalizacin. La colecistectoma se  realiza tpicamente con un flexible instrumento parecido al telescopio (laparoscopio) a travs de pequeas incisiones.  En raras ocasiones, algunos casos pueden requerir [place], con una incisin ms grande. La ciruga abierta es necesaria cuando la CPRE no tiene xito para quitar la piedra o si la ciruga laparoscpica no tiene xito para extirpar la vescula.  La insercin de un tubo de drenaje biliar (colecistectoma) se puede realizar para cuidar de clculos del conducto biliar comn. En este procedimiento, se inserta un tubo en la vescula biliar o los conductos biliares en el hgado para aliviar la presin causada por la piedra. Despus de que el paciente mejore, la vescula biliar ser eliminado y la piedra tratada. PREVENCIN   Limite el consumo de grasas y colesterol.  Evite las dietas mgicas o la prdida rpida de [place].  Mantenga un peso saludable. INSTRUCCIONES PARA EL CUIDADO EN EL HOGAR   Tome la medicacin para Primary school teacher como se le indic.  Limite el consumo de grasas y colesterol si siente dolor o hinchazn. Trate de hacer comidas pequeas.  Concurra a las visitas de control con el mdico para Wellsite geologist, segn las indicaciones.  Si le han realizado un procedimiento o una ciruga, siga las indicaciones especricas del mdico para los cuidados posteriores. SOLICITE ATENCIN MDICA SI:  Los ataques son ms frecuentes e impactan en sus actividades diarias.  SOLICITE ATENCIN MDICA DE INMEDIATO SI:   El dolor no desaparece o se agrava (dura hasta 5 horas).  Tiene fiebre.  Siente nuseas.  Tiene vmitos.  Parece tener ictericia.  Las heces son de Training and development officer rojizo. ASEGRESE DE QUE:   Comprende estas instrucciones.  Controlar su enfermedad.  Solicitar ayuda de inmediato si no mejora o si empeora. Document Released: 05/25/2011 Document Revised: 12/05/2011 [facility] Patient Information 2013 [place], [place].  Enfermedad por  Helicobacter Pylori y lcera (Helicobacter Pylori and Ulcer Disease) Es posible que exista una lcera en su estmago (lcera gstrica) o en la primera parte del intestino delgado, lo que se denomina el duodeno (lcera duodenal). Una lcera es una ruptura en el recubrimiento del estmago o del duodeno. La ruptura avanza hacia el tejido ms profundo. El Helicobacter pylori (H. Pylori) es un tipo de germen (bacteria) responsable de la mayora de las lceras gstricas o duodenales. CAUSAS  Un germen (bacteria). El H. pylori puede debilitar la mucosa protectora que cubre el estmago y [place]. Esto permite que ingrese cido en el recubrimiento sensible del estmago o duodeno y entonces puede formarse una lcera.  Ciertos medicamentos.  Utilizan sustancias que puedan irritar el recubrimiento del estmago (alcohol, tabaco, o medicamentos tales como Advil o Motrin) en la presencia de una infeccin por H. pylori. Esto puede aumentar las probabilidades de tener una lcera.  Cncer (poco frecuente). La mayor parte de las personas infectadas con H. pylori no tienen lceras. No se sabe de que [employer] se contagian el H. pylori. Podra ser a travs de alimentos o del agua. El H. pylori se ha hallado en la saliva de algunas personas infectadas. Por lo tanto, es posible que la bacteria tambin se contagie a travs del contacto boca a boca, tal como al besar. SNTOMAS Los problemas (sntomas) de las lceras normalmente son:  Ardor persistente en la parte superior del vientre (abdomen). A menudo esto empeora si se  tiene el estmago vaco. Puede mejorar consumiendo alimentos. Puede estar asociado con sentir ganas de vomitar (nuseas), hinchazn y vmitos.  Si la lcera resulta en sangrado, puede causar:  Materia fecal de color negro alquitranado.  Vmitos de sangre roja brillante  Vmito de una sustancia similar a la borra del caf. Si la hemorragia es grave, puede haber prdida de la conciencia y  shock. Adems de lceras, el H. pylori tambin puede causar gastritis crnica (irritacin del recubrimiento del estmago, sin lcera) o un Programme researcher, broadcasting/film/video cido. Es posible que no tenga sntomas aunque tenga una infeccin por H. pylori. Aunque se trata de una infeccin, es posible que no tenga los sntomas comunes de una infeccin (tal como la [place]). DIAGNSTICO Las lceras se pueden diagnosticar de Environmental consultant. Si tiene [place], es importante que sepa si fue causada o no por H. pylori. El tratamiento para una lcera causada por el H. pylori es diferente al tratamiento para una lcera producida por otras causas. La mejor forma de Engineer, manufacturing H. pylori es obtener tejido directamente de la lcera durante un examen endoscpico.   Neomia Dear endoscopa es un examen en el que se utiliza un endoscopio. Un endoscopio es un tubo fino que [place] y tiene una pequea cmara en el extremo. Es como un telescopio flexible. Se le dan drogas al paciente para qu est ms calmo (sedante). El profesional introduce el endoscopio por la boca, y lo hace bajar hacia el estmago y [place]. Esto le permite al mdico observar el tejido que cubre el esfago, el [employer] y [place].  Si no es necesario realizar una endoscopa, entonces l H. pylori puede detectarse con exmenes de sangre, de materia fecal, o incluso de aliento. TRATAMIENTO  El tratamiento de una lcera pptica por H. pylori normalmente incluye una combinacin de:  Medicamentos que destruyen grmenes (antibiticos).  cido-supresores.  Protectores de [employer] and [employer]ity [employer].  No se recomienda el uso de un solo medicamento para tratar el H. Pylori. La forma ms eficaz de tratar el problema consiste en administrar durante 2 semanas lo que se conoce como terapia triple. Esta incluye el uso de dos antibiticos para Wellsite geologist las bacterias y un supresor de la secrecin de cido, o un protector del revestimiento gstrico. La terapia triple administrada durante dos  semanas reduce los sntomas de la [place], destruye las bacterias y previene que se vuelvan a formar lceras en muchos pacientes.  Desafortunadamente, a las Optometrist complicado porque exige tomar hasta 20 pastillas al [employer], los antibiticos que se utilizan para la terapia triple pueden causar leves efectos secundarios. [address] se incluyen nuseas, vmitos, diarrea, heces de color oscuro, sabor metlico, [place], dolores de [place] e infecciones por levaduras en las mujeres. Consulte con el profesional que lo asiste si tiene alguno de [employer]. INSTRUCCIONES PARA EL CUIDADO DOMICILIARIO  Tome los medicamentos segn las indicaciones y por todo el tiempo en que se los hayan prescripto. Comuniquese con profesional que lo asiste si tiene problemas o sufre efectos adversos debido a los medicamentos.  Contine con [place] y actividades habituales a menos que el profesional que lo asiste le aconseje otra cosa.  Evite el tabaco, el alcohol, y la cafena. El tabaco disminuir la velocidad de curacin.  Evite los medicamentos que puedan ser nocivos. Entre ellos se incluye la aspirina y los AINES como el ibuprofeno y el naproxeno.  Evite aquellos elementos que Naval architect su estado o Forensic scientist.  Hay disponibles muchos medicamentos de venta  libre con los que se puede controlar el cido estomacal y otros sntomas. Converse con el profesional que lo asiste antes de utilizarlos. No cambie los medicamentos de prescripcin por medicamentos de venta libre sin hablarlo con el profesional.  Generalmente no es necesario llevar dietas especiales.  Cumpla con las citas y exmenes de sangre tal como se le indic. SOLICITE ATENCIN MDICA SI:  El dolor u otros sntomas de la lcera no mejoran luego de [address] de iniciado el [place].  Presenta diarrea. Este problema puede estar relacionado con algunos tratamientos.  Tiene indigestin o acidez gstrica continua  an cuando los principales sntomas de la lcera hayan mejorado.  Cree que tiene cualquier efecto secundario de los medicamentos o si no comprende cmo Chemical engineer sus medicamentos correctamente. SOLICITE ATENCIN MDICA DE INMEDIATO SI: Reece Agar lo siguiente:  Desarrolla una hemorragia rectal con sangre de color rojo brillante.  Tiene deposiciones de color negro alquitranado.  Vomita sangre.  Se siente mareado, dbil, tiene episodios de [place], [place] y siente fro.  Experimenta un dolor abdominal intenso que no puede controlar con los [employer]. No tome medicamentos para el dolor a menos que se lo haya indicado el profesional que lo asiste. EST SEGURO QUE:   Comprende las instrucciones para el alta mdica.  Controlar su enfermedad.  Solicitar atencin mdica de inmediato segn las indicaciones. Document Released: 09/12/2005 Document Revised: 12/05/2011 [facility] Patient Information 2013 [place], [place]. lcera pptica (Peptic Ulcers) Las lceras son pequeos crteres o llagas abiertas que se producen en el tejido que cubre el estmago o el duodeno (la primera porcin del intestino delgado). El trmino lcera pptica se [place] para describir ambos tipos de Occupational hygienist. Existen varios tipos de tratamiento que [employer] molestias que ocasionan las lceras. En la mayor parte de los casos las lceras se curan. CAUSAS Y CARACTERSTICAS COMUNES DE LAS LCERAS PPTICAS Las lceras ppticas se producen slo en aquellas reas del aparato digestivo que entran en contacto con los jugos gstricos. Estos jugos se segregan (producen) Higher education careers adviser. Incluyen un cido y [place] enzima denominada pepsina, que tiene la funcin de digerir las protenas. Muchas personas que sufren lcera duodenal tienen demasiada secrecin de jugos gstricos en el estmago. La mayor parte de las personas con lceras gstricas (en el estmago) tienen cantidades normales o por debajo de lo normal de cido [employer].  Algunas veces, la membrana mucosa (el tejido protector) del estmago y el duodeno no cumple bien su funcin; esto puede ser un factor que facilita el desarrollo de las lceras. Las lceras duodenales generalmente ocasionan dolor en una regin pequea entre el esternn y el ombligo. El dolor vara desde el dolor por hambre hasta una constante sensacin (percepcin) lacerante o ardiente. Algunas veces el dolor se siente durante el sueo y puede despertar a la persona durante la noche. Con frecuencia el dolor se produce dos o tres horas despus de comer, cuando el estmago est vaco. Otros sntomas (problemas) comunes incluyen la ingestin excesiva de alimentos para Engineer, materials. El comer alivia el dolor de una lcera duodenal. El dolor de la lcera gstrica puede sentirse en los mismos lugares que el dolor por lcera duodenal, o ligeramente ms [place]. Tambin puede haber una sensacin de sentirse lleno, de indigestin y [place]. Algunas veces el dolor se produce cuando el estmago est lleno. Esto ocasiona prdida del apetito seguida de prdida de peso. INSTRUCCIONES PARA EL CUIDADO DOMICILIARIO  Se ha comprobado que el consumo de tabaco entorpece la curacin de una lcera.  DEJE DE FUMAR.  Evite el alcohol, la aspirina y otros medicamentos antiinflamatorios (que alivian la hinchazn y la irritacin). Estas sustancias debilitan el tejido que Doctor, hospital.  Consuma alimentos nutritivos y a intervalos regulares.  Evite los alimentos que le hagan mal.  Tome los medicamentos y los anticidos como se le indic. Hay medicamentos de venta libre para Futures trader. Los medicamentos recetados reducen la secrecin de cido, bloquean su produccin o proporcionan una cubierta protectora sobre la [place]. Si le han recetado un anticido especfico, no intercambie las marcas sin la aprobacin del profesional que lo asiste. Generalmente la ciruga no es necesaria. La dieta y/o tratamiento con  medicamentos son eficaces. La ciruga es necesaria si hubo una perforacin, obstruccin debido a Physiological scientist cicatriz o una hemorragia incontrolable, o si hay un dolor muy intenso que no puede controlarse de [place]. SOLICITE ATENCIN MDICA DE INMEDIATO SI:  Observa signos de hemorragia. Aqu se incluye el vomitar sangre fresca de color rojo brillante o evacuar materia fecal de aspecto negro alquitranado.  Si siente debilidad, fatiga o pierde el conocimiento. Estos sntomas pueden ser el resultado de una hemorragia (sangrado) grave. La consecuencia puede ser un estado de shock.  Si tiene dolor abdominal (en el vientre) sbito e intenso. ste es Financial risk analyst signo de perforacin. Si esto ocurre ser necesario un tratamiento quirrgico inmediato.  Si siente un dolor intenso y presenta vmitos continuos. Esto puede indicar una obstruccin del tracto digestivo. Document Released: 06/22/2005 Document Revised: 12/05/2011 [facility] Patient Information 2013 [place], [place].

## 2012-10-18 NOTE — Progress Notes (Signed)
Jenna Montoya [birth year] [id number]   History:    42 y.o.   is an 42 y.o. female. Who presented for her annual gynecological examination had voiced for several months she has been experiencing midepigastric discomfort. She is not an urgent care and I prescribed her Prilosec and stated that it has helped her some. She states that the symptoms are triggered with meals. She states that she belches a lot has metallic taste in her mouth. She denies any hematochezia.  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 Irish Lack. (hematologist oncologist). She is getting her CBCs every 6 months and seen him once a year.  Patient had a normal Pap smear in 2012. She denied any prior history of abnormal Pap smear. Her last mammogram was in October 2013 which was normal. Patient does her monthly self breast examination.   Past medical history,surgical history, family history and social history were all reviewed and documented in the EPIC chart.  Gynecologic History Patient's last menstrual period was 02/09/2005. Contraception: status post hysterectomy Last Pap: 2012. Results were: normal Last mammogram: 2012. Results were: normal  Obstetric History OB History    Grav Para Term Preterm Abortions TAB SAB Ect Mult Living   4 4 4       4      # Outc Date GA Lbr Len/2nd Wgt Sex Del Anes PTL Lv   1 TRM     M SVD  No Yes   2 TRM     F SVD  No Yes   3 TRM     M SVD  No Yes   4 TRM     F CS  No Yes       ROS: A ROS was performed and pertinent positives and negatives are included in the history.  GENERAL: No fevers or chills. HEENT: No change in vision, no earache, sore throat or sinus congestion. NECK: No pain or stiffness. CARDIOVASCULAR: No chest pain or pressure. No palpitations. PULMONARY:  No shortness of breath, cough or wheeze. GASTROINTESTINAL: Midepigastric pains, nausea, vomiting or diarrhea, melena or bright red blood per rectum. GENITOURINARY: No urinary frequency, urgency, hesitancy or dysuria. MUSCULOSKELETAL: No joint or muscle pain, no back pain, no recent trauma. DERMATOLOGIC: No rash, no itching, no lesions. ENDOCRINE: No polyuria, polydipsia, no heat or cold intolerance. No recent change in weight. HEMATOLOGICAL: No anemia or easy bruising or bleeding. NEUROLOGIC: No headache, seizures, numbness, tingling or weakness. PSYCHIATRIC: No depression, no loss of interest in normal activity or change in sleep pattern.     Exam: chaperone present  BP 120/88  Ht 4' 11.75" (1.518 m)  Wt 123 lb (55.792 kg)  BMI 24.22 kg/m2  LMP 02/09/2005  Body mass index is 24.22 kg/(m^2).  General appearance : Well developed well nourished female. No acute distress HEENT: Neck supple, trachea midline, no carotid bruits, no thyroidmegaly Lungs: Clear to auscultation, no rhonchi or wheezes, or rib retractions  Heart: Regular rate and rhythm, no murmurs or gallops Breast:Examined in sitting and supine position were symmetrical in appearance, no palpable masses or tenderness,  no skin retraction, no nipple inversion, no nipple discharge, no skin discoloration, no axillary or supraclavicular lymphadenopathy Abdomen: no palpable masses tenderness was elicited midepigastric region Extremities: no edema or skin discoloration or tenderness  Pelvic:  [place], Urethra,  [name] Glands: Within normal limits             Vagina: No gross lesions or discharge  Cervix: No gross lesions or discharge  Uterus  Absent  Adnexa  Without masses or tenderness  Anus and perineum  normal   Rectovaginal  normal sphincter tone without palpated masses or tenderness             Hemoccult not indicated     Assessment/Plan:  42 y.o. female for annual exam with midepigastric discomfort especially associated with  meals makes one suspicious of cholelithiasis. Also her symptoms could be attributed to H. pylori for which we will test today. Also there is the possibility of gastric ulcer. Patient wants to wait on the ultrasound and wait for the results of the H. pylori. We'll schedule the ultrasound at a later date  and possibly refer her to a general surgeon if indeed cholelithiasis is noted or a GI consultation if upper abdominal ultrasound was negative. The following labs were today : CBC, screening cholesterol, H. pylori, urinalysis and Pap smear. Patient was reminded that she needs her mammogram next month. We discussed importance of monthly self breast examination. All the above was explained in Spanish and literature information was provided. Patient not interested in flu vaccine.    Ok Edwards MD, 5:42 PM 10/18/2012

## 2012-10-19 ENCOUNTER — Encounter: Payer: Self-pay | Admitting: Gynecology

## 2012-10-19 LAB — URINALYSIS W MICROSCOPIC + REFLEX CULTURE
Casts: NONE SEEN
Crystals: NONE SEEN
Glucose, UA: NEGATIVE mg/dL
Hgb urine dipstick: NEGATIVE
Ketones, ur: NEGATIVE mg/dL
Leukocytes, UA: NEGATIVE
Specific Gravity, Urine: 1.024 (ref 1.005–1.030)
pH: 5.5 (ref 5.0–8.0)

## 2013-02-15 ENCOUNTER — Other Ambulatory Visit (HOSPITAL_BASED_OUTPATIENT_CLINIC_OR_DEPARTMENT_OTHER): Payer: No Typology Code available for payment source | Admitting: Lab

## 2013-02-15 ENCOUNTER — Telehealth: Payer: Self-pay | Admitting: Oncology

## 2013-02-15 ENCOUNTER — Ambulatory Visit (HOSPITAL_BASED_OUTPATIENT_CLINIC_OR_DEPARTMENT_OTHER): Payer: No Typology Code available for payment source | Admitting: Nurse Practitioner

## 2013-02-15 VITALS — BP 118/66 | HR 56 | Temp 98.4°F | Resp 18 | Ht 59.0 in | Wt 120.3 lb

## 2013-02-15 DIAGNOSIS — D693 Immune thrombocytopenic purpura: Secondary | ICD-10-CM

## 2013-02-15 LAB — CBC WITH DIFFERENTIAL/PLATELET
Basophils Absolute: 0 10*3/uL (ref 0.0–0.1)
Eosinophils Absolute: 0 10*3/uL (ref 0.0–0.5)
HCT: 42.3 % (ref 34.8–46.6)
HGB: 14.2 g/dL (ref 11.6–15.9)
MCH: 30.9 pg (ref 25.1–34.0)
MONO#: 0.4 10*3/uL (ref 0.1–0.9)
NEUT#: 2.9 10*3/uL (ref 1.5–6.5)
NEUT%: 49.4 % (ref 38.4–76.8)
RDW: 13.5 % (ref 11.2–14.5)
lymph#: 2.5 10*3/uL (ref 0.9–3.3)

## 2013-02-15 NOTE — Telephone Encounter (Signed)
gv pt appt schedule for November 2014 and May 2015.  °

## 2013-02-15 NOTE — Progress Notes (Signed)
OFFICE PROGRESS NOTE  Interval history:  Jenna Montoya returns as scheduled. She feels well. She denies bleeding. She continues to note easy bruising. Earlier this year she was experiencing some abdominal pain. She was prescribed some "pills". The pain has resolved.   Objective: Blood pressure 118/66, pulse 56, temperature 98.4 F (36.9 C), temperature source Oral, resp. rate 18, height 4\' 11"  (1.499 m), weight 120 lb 4.8 oz (54.568 kg), last menstrual period 02/09/2005.  No thrush or ulcerations. No palpable cervical, supraclavicular or axillary lymph nodes. Lungs are clear. Regular cardiac rhythm. Abdomen is soft and nontender. No hepatomegaly. Extremities are without edema.  Lab Results: Lab Results  Component Value Date   WBC 5.9 02/15/2013   HGB 14.2 02/15/2013   HCT 42.3 02/15/2013   MCV 92.2 02/15/2013   PLT 151 02/15/2013    Chemistry:    Chemistry   No results found for this basename: NA, K, CL, CO2, BUN, CREATININE, GLU   No results found for this basename: CALCIUM, ALKPHOS, AST, ALT, BILITOT       Studies/Results: No results found.  Medications: I have reviewed the patient's current medications.  Assessment/Plan:  1. Chronic ITP.  2. History of iron-deficiency anemia, resolved.  Disposition-she remains stable from a hematologic standpoint. She will return for a CBC in 6 months and a followup visit in one year. She will contact the office in the interim with increased bruising or bleeding.  Plan reviewed with Dr. Derenda Fennel, Misty Stanley ANP/GNP-BC

## 2013-04-02 ENCOUNTER — Encounter: Payer: Self-pay | Admitting: Gynecology

## 2013-08-19 ENCOUNTER — Other Ambulatory Visit: Payer: No Typology Code available for payment source

## 2013-12-17 ENCOUNTER — Ambulatory Visit (INDEPENDENT_AMBULATORY_CARE_PROVIDER_SITE_OTHER): Payer: BC Managed Care – PPO | Admitting: Gynecology

## 2013-12-17 ENCOUNTER — Encounter: Payer: Self-pay | Admitting: Gynecology

## 2013-12-17 VITALS — BP 120/78 | Ht 60.0 in | Wt 125.0 lb

## 2013-12-17 DIAGNOSIS — Z01419 Encounter for gynecological examination (general) (routine) without abnormal findings: Secondary | ICD-10-CM

## 2013-12-17 DIAGNOSIS — K2990 Gastroduodenitis, unspecified, without bleeding: Secondary | ICD-10-CM

## 2013-12-17 DIAGNOSIS — K2970 Gastritis, unspecified, without bleeding: Secondary | ICD-10-CM

## 2013-12-17 DIAGNOSIS — D693 Immune thrombocytopenic purpura: Secondary | ICD-10-CM

## 2013-12-17 DIAGNOSIS — Z23 Encounter for immunization: Secondary | ICD-10-CM

## 2013-12-17 NOTE — Progress Notes (Signed)
Charlton HawsFilomena Blok 09/03/1971 [id number]   History:    43 y.o.  for annual gyn exam with no major complaints today. Patient last year was evaluated for midepigastric pain was tested for H. pylori and was negative. [facility] who had done biopsies but we do not have the result and she is currently on Prilosec.  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 Truett PernaSherrill (hematologist oncologist). She's gave her CBC every 6 months and sees him once a year. She is scheduled for followup in 2 months.  Patient with no prior history of abnormal Pap smears.   Past medical history,surgical history, family history and social history were all reviewed and documented in the EPIC chart.  Gynecologic History Patient's last menstrual period was 02/09/2005. Contraception: status post hysterectomy Last Pap: 2014. Results were: normal Last mammogram: 2014. Results were: normal  Obstetric History OB History  Gravida Para Term Preterm AB SAB TAB Ectopic Multiple Living  4 4 4       4     # Outcome Date GA Lbr Len/2nd Weight Sex Delivery Anes PTL Lv  4 TRM     F CS  N Y  3 TRM     M SVD  N Y  2 TRM     F SVD  N Y  1 TRM     M SVD  N Y       ROS: A ROS was performed and pertinent positives and negatives are included in the history.  GENERAL: No fevers or chills. HEENT: No change in vision, no earache, sore throat or sinus congestion. NECK: No pain or stiffness. CARDIOVASCULAR: No chest pain or pressure. No palpitations. PULMONARY: No shortness of breath, cough or wheeze. GASTROINTESTINAL: No abdominal pain, nausea, vomiting or diarrhea, melena or bright red blood per rectum. GENITOURINARY: No urinary frequency, urgency, hesitancy or  dysuria. MUSCULOSKELETAL: No joint or muscle pain, no back pain, no recent trauma. DERMATOLOGIC: No rash, no itching, no lesions. ENDOCRINE: No polyuria, polydipsia, no heat or cold intolerance. No recent change in weight. HEMATOLOGICAL: No anemia or easy bruising or bleeding. NEUROLOGIC: No headache, seizures, numbness, tingling or weakness. PSYCHIATRIC: No depression, no loss of interest in normal activity or change in sleep pattern.     Exam: chaperone present  BP 120/78  Ht 5' (1.524 m)  Wt 125 lb (56.7 kg)  BMI 24.41 kg/m2  LMP 02/09/2005  Body mass index is 24.41 kg/(m^2).  General appearance : Well developed well nourished female. No acute distress HEENT: Neck supple, trachea midline, no carotid bruits, no thyroidmegaly Lungs: Clear to auscultation, no rhonchi or wheezes, or rib retractions  Heart: Regular rate and rhythm, no murmurs or gallops Breast:Examined in sitting and supine position were symmetrical in appearance, no palpable masses or tenderness,  no skin retraction, no nipple inversion, no nipple discharge, no skin discoloration, no axillary or supraclavicular lymphadenopathy Abdomen: no palpable masses or tenderness, no rebound or guarding Extremities: no edema or skin discoloration or tenderness  Pelvic:  Bartholin, Urethra, [name] Glands: Within normal limits             Vagina: No gross lesions or discharge  Cervix: Absent  Uterus absent  Adnexa  Without masses or tenderness  Anus and perineum  normal  Rectovaginal  normal sphincter tone without palpated masses or tenderness             Hemoccult not indicated     Assessment/Plan:  43 y.o. female for annual exam doing well. The following labs were ordered: Comprehensive metabolic panel, TSH, screen cholesterol, urinalysis as well as vitamin D level (had complained of tiredness and fatigue). Her hematologist will be drawn for CBC. She was reminded to do her monthly breast exam. Pap smear not done in accordance to  the new guidelines. We discussed importance of calcium vitamin D and regular exercise for osteoporosis prevention. Patient was to receive the Tdap vaccine today  Note: This dictation was prepared with  Dragon/digital dictation along withSmart phrase technology. Any transcriptional errors that result from this process are unintentional.   Ok Edwards MD, 5:38 PM 12/17/2013

## 2013-12-17 NOTE — Patient Instructions (Signed)
Vacuna difteria, tétanos, tos ferina (DTP) - Lo que debe saber   (Tetanus, Diphtheria, Pertussis [Tdap] Vaccine, What You Need to Know)  ¿PORQUÉ VACUNARSE?   El tétanos, la difteria y la tos ferina pueden ser enfermedades muy graves, aún en adolescentes y adultos. La vacuna Tdap nos puede proteger de estas enfermedades.   El TÉTANOS (Trismo) provoca la contracción dolorosa de los músculos, por lo general, en todo el cuerpo.   · Puede causar el endurecimiento de los músculos de la cabeza y el cuello, de modo que impide abrir la boca, tragar y en algunos casos, respirar. El tétanos causa la muerte de 1 de cada 5 personas que se infectan.  La DIFTERIA produce la formación de una membrana gruesa que cubre el fondo de la garganta.   · Puede causar problemas respiratorios, parálisis, insuficiencia cardíaca e incluso la muerte.  TOS FERINA (Pertusis) causa episodios de tos graves, que pueden hacer difícil la respiración, causar vómitos y trastornos del sueño.   · También puede ser la causa de pérdida de peso, incontinencia y fractura de costillas. Dos de cada 100 adolescentes y cinco de cada 100 adultos que enferman de pertusis deben ser hospitalizados, tienen complicaciones como la neumonía o mueren.  Estas enfermedades son provocadas por bacterias. La difteria y el pertusis se contagian de persona a persona a través de la tos o el estornudo. El tétanos ingresa al organismo a través de cortes, rasguños o heridas.   Antes de las vacunas, en los [place] se vieron más de 200.000 casos al año de difteria y tos ferina y cientos de casos de tétanos. Desde el inicio de la vacunación, los casos de tétanos y difteria han disminuido alrededor del 99% y los casos de tos ferina alrededor del 80%.   Tdap   La vacuna Tdap protege a adolescentes y adultos contra el tétanos, la difteria y la tos ferina. Una dosis de Tdap se administra a los 11 o 12 años de edad. Las personas que no recibieron la vacuna Tdap a esa edad deben  recibirla tan pronto como sea posible.   Es muy importante que los profesionales de la salud y todos aquellos que tengan contacto cercano con bebés menores de 12 meses reciban la Tdap.   Las mujeres embarazadas deben recibir una dosis de Tdap en cada embarazo, para proteger al recién nacido de la tos ferina. Los niños tienen mayor riesgo de complicaciones graves y potencialmente mortales debido a la tos ferina.   Una vacuna similar, llamada Td, protege contra el tétanos y la difteria, pero no contra la tos ferina. Cada 10 años debe recibirse un refuerzo de Td. La Tdap se puede administrar como uno de estos refuerzos, si todavía no ha recibido una dosis. También se puede aplicar después de un corte o quemadura grave para prevenir la infección por tétanos.   El médico le dará más información.   La Tdap puede administrarse de manera segura simultáneamente con otras vacunas.   ALGUNAS PERSONAS NO DEBEN RECIBIR ESTA VACUNA.   · Si alguna vez tuvo una reacción alérgica potencialmente mortal después de una dosis de la vacuna contra el tétanos, la diferia o la tos ferina, o tuvo una alergia grave a cualquiera de los componentes de esta vacuna, no debe aplicarse la vacuna. Informe a su médico si usted sufre algún tipo de alergia grave.  · Si estuvo en coma o sufrió múltiples convulsiones dentro de los 7 días posteriores después de una dosis de DTP o DTaP   no debe recibir la Tdap, salvo que se encuentre otra causa En este caso puede recibir la Td.  · Consulte con su médico si:  · tiene epilepsia u otra enfermedad del sistema nervioso,  · siente dolor intenso o se hincha después de recibir cualquier vacuna contra la difteria, el tétanos o la tos ferina,  · alguna vez ha sufrido el síndrome de Guillain-Barré,  · no se siente bien el día en que se ha programado la vacuna.  RIESGOS DE UNA REACCIÓN A LA VACUNA  Con cualquier medicamento, incluyendo las vacunas, existe la posibilidad de que aparezcan efectos secundarios. Estos son  leves y desaparecen por sí solos, pero también son posibles las reacciones graves.   Breves episodios de desmayo pueden seguir a una vacunación, causando lesiones por la caída. Sentarse o recostarse durante 15 minutos puede ayudar a evitarlo. Informe al médico si se siente mareado o aturdido, tiene cambios en la visión o zumbidos en los oídos.   Problemas leves luego de la Tdap (no interferirán con las actividades)   · Dolor en el sitio de la inyección (alrededor de 1 de cada 4 adolescentes o 2 de cada 3 adultos).  · Enrojecimiento o hinchazón en el lugar de la inyección (1 de cada 5 personas).  · Fiebre leve de al menos 100,4° F (38° C) (hasta alrededor de 1 cada 25 adolescentes y 1 de cada 100 adultos).  · Dolor de cabeza (3 o 4de cada 10 personas).  · Cansancio (1 de cada 3 o 4 personas).  · Náuseas, vómitos, diarrea, dolor de estómago (1 de cada 4 adolescentes o 1 de cada 10 adultos).  · Escalofríos, dolores corporales, dolor articular, erupciones, inflamación de las glándulas (poco frecuente).  Problemas moderados: (interfieren con las actividades, pero no requieren atención médica)   · Dolor en el lugar de la inyección (1 de cada 5 adolescentes o 1 de cada 100 adultos).  · Enrojecimiento o inflamación (1 de cada 16 adolescentes y 1 de cada 25 adultos).  · Fiebre de más de 102°F o 38,9°C (1 de cada 100 adolescentes o 1 de cada 250 adultos).  · Dolor de cabeza (alrededor de 4 de cada 20 adolescentes y 3 de cada 10 adultos).  · Náuseas, vómitos, diarrea, dolor de estómago (1 a 3 de cada 100 personas).  · Hinchazón de todo el brazo en el que se aplicó la vacuna (3 de cada100 personas).  Problemas graves: luego de la Tdap (no puede realizar las actividades habituales, requiere atención médica)   · Inflamación, dolor intenso, sangrado y enrojecimiento en el brazo, en el sitio de la inyección (poco frecuente).  Una reacción alérgica grave puede ocurrir después de la administración de cualquier vacuna (se estima en  menos de 1 en un millón de dosis).   ¿QUÉ PASA SI HAY UNA REACCIÓN GRAVE?   ¿Qué signos debo buscar?  · Observe todo lo que le preocupe, como signos de una reacción alérgica grave, fiebre muy alta o cambios en el comportamiento.  Los signos de una reacción alérgica grave pueden incluir urticaria, hinchazón de la cara y la garganta, dificultad para respirar, ritmo cardíaco acelerado, mareos y debilidad. Estos síntomas pueden comenzar entre unos pocos minutos y algunas horas después de la vacunación.   ¿Qué debo hacer?  · Si usted piensa que se trata de una reacción alérgica grave o de otra emergencia que no puede esperar, llame al 911 o lleve a la persona al hospital más cercano. De lo contrario, llame   a su médico.  · Después, la reacción debe informarse a la "Vaccine Adverse Event Reporting System" (Sistema de información sobre efectos adversos de las vacunas -VAERS). El médico o usted mismo pueden realizar el informe en el sitio web del VAERS www.vaers.hhs.govo llame al 1-800-822-7967.  El VAERS es sólo para informar reacciones. No brindan consejo médico.   PROGRAMA NACIONAL DE COMPENSACIÓN DE DAÑOS POR VACUNAS   El National Vaccine Injury Compensation Program (VICP) es un programa federal que fue creado para compensar a las personas que puedan haber sufrido daños al recibir ciertas vacunas.   Aquellas personas que consideren que han sufrido un daño como consecuencia de una vacuna y quieren saber más acerca del programa y como presentar una denuncia, pueden llamar 1-800-338-2382 o visite el sitio web del VICP en www.hrsa.gov/vaccinecompensation.   ¿CÓMO PUEDO OBTENER MÁS INFORMACIÓN?   · Consulte a su médico.  · Comuníquese con el servicio de salud de su localidad o su estado.  · Comuníquese con los Centros para el control y la prevención de enfermedades (Centers for Disease Control and Prevention , CDC).  · llamando al 1-800-232-4636 o visitando el sitio web del CDC en www.cdc.gov/vaccines.  CDC Tdap Vaccine VIS  (02/02/12)   Document Released: 08/29/2012  ExitCare® Patient Information ©2014 [employer].

## 2013-12-18 ENCOUNTER — Encounter: Payer: Self-pay | Admitting: Gynecology

## 2013-12-18 ENCOUNTER — Other Ambulatory Visit: Payer: Self-pay | Admitting: Gynecology

## 2013-12-18 DIAGNOSIS — E559 Vitamin D deficiency, unspecified: Secondary | ICD-10-CM

## 2013-12-18 LAB — CHOLESTEROL, TOTAL: Cholesterol: 171 mg/dL (ref 0–200)

## 2013-12-18 LAB — COMPREHENSIVE METABOLIC PANEL
ALK PHOS: 70 U/L (ref 39–117)
ALT: 17 U/L (ref 0–35)
AST: 17 U/L (ref 0–37)
Albumin: 4.3 g/dL (ref 3.5–5.2)
BILIRUBIN TOTAL: 0.8 mg/dL (ref 0.2–1.2)
BUN: 15 mg/dL (ref 6–23)
CO2: 25 mEq/L (ref 19–32)
Calcium: 9 mg/dL (ref 8.4–10.5)
Chloride: 106 mEq/L (ref 96–112)
Creat: 0.67 mg/dL (ref 0.50–1.10)
Glucose, Bld: 93 mg/dL (ref 70–99)
Potassium: 3.7 mEq/L (ref 3.5–5.3)
SODIUM: 139 meq/L (ref 135–145)
TOTAL PROTEIN: 7.1 g/dL (ref 6.0–8.3)

## 2013-12-18 LAB — VITAMIN D 25 HYDROXY (VIT D DEFICIENCY, FRACTURES): Vit D, 25-Hydroxy: 29 ng/mL — ABNORMAL LOW (ref 30–89)

## 2013-12-18 LAB — TSH: TSH: 1.273 u[IU]/mL (ref 0.350–4.500)

## 2014-01-03 ENCOUNTER — Other Ambulatory Visit: Payer: Self-pay | Admitting: Gynecology

## 2014-01-03 MED ORDER — VITAMIN D (ERGOCALCIFEROL) 1.25 MG (50000 UNIT) PO CAPS: 50000.0000 [IU] | ORAL_CAPSULE | ORAL | Status: DC

## 2014-01-04 ENCOUNTER — Other Ambulatory Visit: Payer: Self-pay | Admitting: Gynecology

## 2014-01-10 ENCOUNTER — Telehealth: Payer: Self-pay | Admitting: *Deleted

## 2014-01-10 NOTE — Telephone Encounter (Signed)
Just have her take a Caltrate plus or Os-Cal or Citracal or Viactiv one twice a day

## 2014-01-10 NOTE — Telephone Encounter (Signed)
Please see the below. 

## 2014-01-10 NOTE — Telephone Encounter (Signed)
Message copied by Aura CampsWEBB, JENNIFER L on Fri Jan 10, 2014  4:20 PM ------      Message from: Jerilynn MagesSHAFFER, CLAUDIA      Created: Fri Jan 10, 2014  3:52 PM      Regarding: rx       JF prescribe Vitamin D, Ergocalciferol, (DRISDOL) 50000 UNITS CAPS capsule; patient states it gives her a stomach ache. Can he prescribe different vitamin D? Let me know I will call her back she is spanish speaking only.              ------

## 2014-01-13 NOTE — Telephone Encounter (Signed)
Jenna CrapeClaudia will informed patient.

## 2014-02-13 ENCOUNTER — Ambulatory Visit: Payer: No Typology Code available for payment source | Admitting: Oncology

## 2014-02-13 ENCOUNTER — Other Ambulatory Visit: Payer: No Typology Code available for payment source

## 2014-03-06 ENCOUNTER — Other Ambulatory Visit (HOSPITAL_BASED_OUTPATIENT_CLINIC_OR_DEPARTMENT_OTHER): Payer: BC Managed Care – PPO

## 2014-03-06 ENCOUNTER — Telehealth: Payer: Self-pay | Admitting: Oncology

## 2014-03-06 ENCOUNTER — Ambulatory Visit (HOSPITAL_BASED_OUTPATIENT_CLINIC_OR_DEPARTMENT_OTHER): Payer: BC Managed Care – PPO | Admitting: Nurse Practitioner

## 2014-03-06 VITALS — BP 110/64 | HR 66 | Temp 98.6°F | Resp 18 | Ht 60.0 in | Wt 128.3 lb

## 2014-03-06 DIAGNOSIS — D693 Immune thrombocytopenic purpura: Secondary | ICD-10-CM

## 2014-03-06 LAB — CBC WITH DIFFERENTIAL/PLATELET
BASO%: 0.4 % (ref 0.0–2.0)
BASOS ABS: 0 10*3/uL (ref 0.0–0.1)
EOS%: 0.2 % (ref 0.0–7.0)
Eosinophils Absolute: 0 10*3/uL (ref 0.0–0.5)
HCT: 40.1 % (ref 34.8–46.6)
HEMOGLOBIN: 13.6 g/dL (ref 11.6–15.9)
LYMPH%: 28.8 % (ref 14.0–49.7)
MCH: 30.9 pg (ref 25.1–34.0)
MCHC: 33.8 g/dL (ref 31.5–36.0)
MCV: 91.4 fL (ref 79.5–101.0)
MONO#: 0.6 10*3/uL (ref 0.1–0.9)
MONO%: 8 % (ref 0.0–14.0)
NEUT#: 4.6 10*3/uL (ref 1.5–6.5)
NEUT%: 62.6 % (ref 38.4–76.8)
PLATELETS: 145 10*3/uL (ref 145–400)
RBC: 4.38 10*6/uL (ref 3.70–5.45)
RDW: 12.9 % (ref 11.2–14.5)
WBC: 7.3 10*3/uL (ref 3.9–10.3)
lymph#: 2.1 10*3/uL (ref 0.9–3.3)

## 2014-03-06 NOTE — Telephone Encounter (Signed)
Gave pt appt for lab and MD for December and September 2015

## 2014-03-06 NOTE — Progress Notes (Signed)
  Jenna Montoya   Diagnosis:  Chronic ITP.  INTERVAL HISTORY:   Jenna Montoya returns for scheduled followup. She overall feels well. She denies any unusual bruising. No bleeding. She periodically notes a low energy level. No fevers or sweats. Appetite is "okay". No nausea or vomiting.  Objective:  Vital signs in last 24 hours:  Blood pressure 110/64, pulse 66, temperature 98.6 F (37 C), temperature source Oral, resp. rate 18, height 5' (1.524 m), weight 128 lb 4.8 oz (58.196 kg), last menstrual period 02/09/2005, SpO2 98.00%.    HEENT: No thrush or ulcerations. Lymphatics: No palpable cervical, supraclavicular or axillary lymph nodes. Resp: Lungs are clear. Cardio: Regular cardiac rhythm. GI: Abdomen soft and nontender. No organomegaly. Vascular: No leg edema.  Lab Results:  Lab Results  Component Value Date   WBC 7.3 03/06/2014   HGB 13.6 03/06/2014   HCT 40.1 03/06/2014   MCV 91.4 03/06/2014   PLT 145 03/06/2014   NEUTROABS 4.6 03/06/2014    Imaging:  No results found.  Medications: I have reviewed the patient's current medications.  Assessment/Plan: 1. Chronic ITP. 2. History of iron deficiency anemia. Resolved.    Disposition: Jenna Montoya remains stable from a hematologic standpoint. She will return for a CBC in 6 months. We will see her in followup in one year. She knows to contact the office in the interim with any problems. We specifically discussed unusual bruising and/or bleeding.    [name] ANP/GNP-BC   03/06/2014  3:22 PM

## 2014-05-20 ENCOUNTER — Encounter: Payer: Self-pay | Admitting: Gynecology

## 2014-07-28 ENCOUNTER — Encounter: Payer: Self-pay | Admitting: Gynecology

## 2014-09-05 ENCOUNTER — Other Ambulatory Visit (HOSPITAL_BASED_OUTPATIENT_CLINIC_OR_DEPARTMENT_OTHER): Payer: BLUE CROSS/BLUE SHIELD

## 2014-09-05 DIAGNOSIS — D693 Immune thrombocytopenic purpura: Secondary | ICD-10-CM

## 2014-09-05 LAB — CBC WITH DIFFERENTIAL/PLATELET
BASO%: 0.3 % (ref 0.0–2.0)
BASOS ABS: 0 10*3/uL (ref 0.0–0.1)
EOS%: 0.3 % (ref 0.0–7.0)
Eosinophils Absolute: 0 10*3/uL (ref 0.0–0.5)
HCT: 40.7 % (ref 34.8–46.6)
HGB: 14.1 g/dL (ref 11.6–15.9)
LYMPH%: 31.3 % (ref 14.0–49.7)
MCH: 31 pg (ref 25.1–34.0)
MCHC: 34.6 g/dL (ref 31.5–36.0)
MCV: 89.5 fL (ref 79.5–101.0)
MONO#: 0.6 10*3/uL (ref 0.1–0.9)
MONO%: 7.2 % (ref 0.0–14.0)
NEUT#: 4.6 10*3/uL (ref 1.5–6.5)
NEUT%: 60.9 % (ref 38.4–76.8)
Platelets: 160 10*3/uL (ref 145–400)
RBC: 4.55 10*6/uL (ref 3.70–5.45)
RDW: 13 % (ref 11.2–14.5)
WBC: 7.6 10*3/uL (ref 3.9–10.3)
lymph#: 2.4 10*3/uL (ref 0.9–3.3)

## 2014-10-24 ENCOUNTER — Telehealth: Payer: Self-pay | Admitting: Oncology

## 2014-10-24 NOTE — Telephone Encounter (Signed)
Lvm advising appt chg from 6/10 (md pm pal) to 6/7 @ 2.30pm.

## 2014-12-25 ENCOUNTER — Ambulatory Visit (INDEPENDENT_AMBULATORY_CARE_PROVIDER_SITE_OTHER): Payer: 59 | Admitting: Gynecology

## 2014-12-25 ENCOUNTER — Encounter: Payer: Self-pay | Admitting: Gynecology

## 2014-12-25 VITALS — BP 120/76 | Ht 60.5 in | Wt 123.0 lb

## 2014-12-25 DIAGNOSIS — D693 Immune thrombocytopenic purpura: Secondary | ICD-10-CM | POA: Diagnosis not present

## 2014-12-25 DIAGNOSIS — Z01419 Encounter for gynecological examination (general) (routine) without abnormal findings: Secondary | ICD-10-CM | POA: Diagnosis not present

## 2014-12-25 LAB — COMPREHENSIVE METABOLIC PANEL
ALT: 11 U/L (ref 0–35)
AST: 16 U/L (ref 0–37)
Albumin: 3.9 g/dL (ref 3.5–5.2)
Alkaline Phosphatase: 56 U/L (ref 39–117)
BUN: 15 mg/dL (ref 6–23)
CALCIUM: 8.8 mg/dL (ref 8.4–10.5)
CHLORIDE: 105 meq/L (ref 96–112)
CO2: 23 mEq/L (ref 19–32)
CREATININE: 0.57 mg/dL (ref 0.50–1.10)
Glucose, Bld: 102 mg/dL — ABNORMAL HIGH (ref 70–99)
POTASSIUM: 3.7 meq/L (ref 3.5–5.3)
Sodium: 140 mEq/L (ref 135–145)
Total Bilirubin: 1 mg/dL (ref 0.2–1.2)
Total Protein: 6.7 g/dL (ref 6.0–8.3)

## 2014-12-25 LAB — LIPID PANEL
Cholesterol: 198 mg/dL (ref 0–200)
HDL: 39 mg/dL — ABNORMAL LOW (ref >=46)
LDL Cholesterol: 127 mg/dL — ABNORMAL HIGH (ref 0–99)
Total CHOL/HDL Ratio: 5.1 Ratio
Triglycerides: 162 mg/dL — ABNORMAL HIGH (ref <150)
VLDL: 32 mg/dL (ref 0–40)

## 2014-12-25 LAB — TSH: TSH: 0.845 u[IU]/mL (ref 0.350–4.500)

## 2014-12-25 NOTE — Progress Notes (Signed)
Jenna Montoya 1970-10-30 [id number]   History:    44 y.o.  for annual gyn exam who presented to the office today with no complaints today. Patient recently was treated for an upper respiratory tract infection. Patient is currently being followed by the medical oncologist because of chronic ITP is scheduled to follow-up this June along with a CBC. 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 Patient with no past history of any abnormal Pap smears prior to her hysterectomy.  Past medical history,surgical history, family history and social history were all reviewed and documented in the EPIC chart.  Gynecologic History Patient's last menstrual period was 02/09/2005. Contraception: status post hysterectomy Last Pap: 2014. Results were: normal Last mammogram: 2015. Results were: normal  Obstetric History OB History  Gravida Para Term Preterm AB SAB TAB Ectopic Multiple Living  # Outcome Date GA Lbr Len/2nd Weight Sex Delivery Anes PTL Lv  4 Term     F CS-Unspec  N Y  3 Term     M Vag-Spont  N Y  2 Term     F Vag-Spont  N Y  1 Term     M Vag-Spont  N Y       ROS: A ROS was performed and pertinent positives and negatives are included in the history.  GENERAL: No fevers or chills. HEENT: No change in vision, no earache, sore throat or sinus congestion. NECK: No pain or stiffness. CARDIOVASCULAR: No chest pain or pressure. No palpitations. PULMONARY: No shortness of breath, cough or wheeze. GASTROINTESTINAL: No abdominal pain, nausea, vomiting or diarrhea, melena or bright red blood per rectum. GENITOURINARY: No urinary frequency, urgency, hesitancy or dysuria. MUSCULOSKELETAL: No joint or muscle pain, no back pain, no recent trauma. DERMATOLOGIC: No rash, no itching, no lesions. ENDOCRINE: No  polyuria, polydipsia, no heat or cold intolerance. No recent change in weight. HEMATOLOGICAL: No anemia or easy bruising or bleeding. NEUROLOGIC: No headache, seizures, numbness, tingling or weakness. PSYCHIATRIC: No depression, no loss of interest in normal activity or change in sleep pattern.     Exam: chaperone present  BP 120/76 mmHg  Ht 5' 0.5" (1.537 m)  Wt 123 lb (55.792 kg)  BMI 23.62 kg/m2  LMP 02/09/2005  Body mass index is 23.62 kg/(m^2).  General appearance : Well developed well nourished female. No acute distress HEENT: Eyes: no retinal hemorrhage or exudates,  Neck supple, trachea midline, no carotid bruits, no thyroidmegaly Lungs: Clear to auscultation, no rhonchi or wheezes, or rib retractions  Heart: Regular rate and rhythm, no murmurs or gallops Breast:Examined in sitting and supine position were symmetrical in appearance, no palpable masses or tenderness,  no skin retraction, no nipple inversion, no nipple discharge, no skin discoloration, no axillary or supraclavicular lymphadenopathy Abdomen: no palpable masses or tenderness, no rebound or guarding Extremities: no edema or skin discoloration or tenderness  Pelvic:  Bartholin, Urethra, [name] Glands: Within normal limits             Vagina: No gross lesions or discharge  Cervix: Absent  Uterus absent  Adnexa  Without masses or tenderness  Anus and perineum  normal   Rectovaginal  normal sphincter tone without palpated masses or tenderness             Hemoccult not  indicated     Assessment/Plan:  44 y.o. female for annual exam with history of chronic ITP being followed by medical oncologist. Last CBC December 2015 normal platelet count. Patient with follow-up appointment CBC and June of this year. Patient's mammogram last year demonstrated she had dense breasts. It was recommended that with her mammogram later this year that she requested three-dimensional mammogram. She was informed of the importance of monthly  breast exam. The following fasting screening labs were ordered: Fasting lipid profile, comprehensive metabolic panel, TSH, and urinalysis. Pap smear no longer needed according to the new guidelines.   Ok EdwardsFERNANDEZ,Kadian Barcellos H MD, 8:57 AM 12/25/2014

## 2014-12-26 ENCOUNTER — Other Ambulatory Visit: Payer: Self-pay | Admitting: Gynecology

## 2014-12-26 DIAGNOSIS — E7800 Pure hypercholesterolemia, unspecified: Secondary | ICD-10-CM

## 2014-12-26 LAB — URINALYSIS W MICROSCOPIC + REFLEX CULTURE
BILIRUBIN URINE: NEGATIVE
Bacteria, UA: NONE SEEN
CASTS: NONE SEEN
CRYSTALS: NONE SEEN
Glucose, UA: NEGATIVE mg/dL
Hgb urine dipstick: NEGATIVE
KETONES UR: NEGATIVE mg/dL
Leukocytes, UA: NEGATIVE
Nitrite: NEGATIVE
PROTEIN: NEGATIVE mg/dL
SQUAMOUS EPITHELIAL / LPF: NONE SEEN
Specific Gravity, Urine: 1.022 (ref 1.005–1.030)
UROBILINOGEN UA: 0.2 mg/dL (ref 0.0–1.0)
pH: 6 (ref 5.0–8.0)

## 2015-01-06 ENCOUNTER — Telehealth: Payer: Self-pay | Admitting: Oncology

## 2015-01-06 NOTE — Telephone Encounter (Signed)
Pt confirmed labs/ov r/s from 06/07 needed am apt , gave pt Calendar.... KJ

## 2015-02-27 ENCOUNTER — Telehealth: Payer: Self-pay | Admitting: Oncology

## 2015-02-27 NOTE — Telephone Encounter (Signed)
Due to PAL moved 6/10 appointments to 7/5. Spoke with patient via [employer] [place](Ricardo [phone]). Patient informed of change and new d/t.

## 2015-03-03 ENCOUNTER — Other Ambulatory Visit: Payer: Self-pay

## 2015-03-03 ENCOUNTER — Ambulatory Visit: Payer: Self-pay | Admitting: Oncology

## 2015-03-06 ENCOUNTER — Ambulatory Visit: Payer: BC Managed Care – PPO | Admitting: Oncology

## 2015-03-06 ENCOUNTER — Ambulatory Visit: Payer: Self-pay | Admitting: Oncology

## 2015-03-06 ENCOUNTER — Other Ambulatory Visit: Payer: Self-pay

## 2015-03-06 ENCOUNTER — Other Ambulatory Visit: Payer: BC Managed Care – PPO

## 2015-03-27 ENCOUNTER — Other Ambulatory Visit: Payer: Self-pay | Admitting: *Deleted

## 2015-03-27 DIAGNOSIS — D693 Immune thrombocytopenic purpura: Secondary | ICD-10-CM

## 2015-03-31 ENCOUNTER — Ambulatory Visit (HOSPITAL_BASED_OUTPATIENT_CLINIC_OR_DEPARTMENT_OTHER): Payer: 59 | Admitting: Nurse Practitioner

## 2015-03-31 ENCOUNTER — Other Ambulatory Visit (HOSPITAL_BASED_OUTPATIENT_CLINIC_OR_DEPARTMENT_OTHER): Payer: 59

## 2015-03-31 ENCOUNTER — Telehealth: Payer: Self-pay | Admitting: Nurse Practitioner

## 2015-03-31 VITALS — BP 132/72 | HR 62 | Temp 98.4°F | Resp 18 | Ht 60.5 in | Wt 126.6 lb

## 2015-03-31 DIAGNOSIS — D693 Immune thrombocytopenic purpura: Secondary | ICD-10-CM | POA: Diagnosis not present

## 2015-03-31 LAB — CBC WITH DIFFERENTIAL/PLATELET
BASO%: 0.6 % (ref 0.0–2.0)
Basophils Absolute: 0 10*3/uL (ref 0.0–0.1)
EOS ABS: 0 10*3/uL (ref 0.0–0.5)
EOS%: 0.5 % (ref 0.0–7.0)
HCT: 42.6 % (ref 34.8–46.6)
HGB: 14.6 g/dL (ref 11.6–15.9)
LYMPH%: 34.5 % (ref 14.0–49.7)
MCH: 31.2 pg (ref 25.1–34.0)
MCHC: 34.3 g/dL (ref 31.5–36.0)
MCV: 90.9 fL (ref 79.5–101.0)
MONO#: 0.6 10*3/uL (ref 0.1–0.9)
MONO%: 8.3 % (ref 0.0–14.0)
NEUT%: 56.1 % (ref 38.4–76.8)
NEUTROS ABS: 3.7 10*3/uL (ref 1.5–6.5)
Platelets: 145 10*3/uL (ref 145–400)
RBC: 4.69 10*6/uL (ref 3.70–5.45)
RDW: 13.3 % (ref 11.2–14.5)
WBC: 6.7 10*3/uL (ref 3.9–10.3)
lymph#: 2.3 10*3/uL (ref 0.9–3.3)

## 2015-03-31 NOTE — Progress Notes (Signed)
   [facility]GRESS [facility]   Diagnosis:  Chronic ITP  INTERVAL HISTORY:   Ms. Jenna Montoya returns as scheduled. She denies any bleeding. She has noted a few bruises. She attributes the bruises to minor trauma occurring at work. No fevers or sweats. She has a good appetite. Her main complaint today is periodic fatigue.  Objective:  Vital signs in last 24 hours:  Blood pressure 132/72, pulse 62, temperature 98.4 F (36.9 C), temperature source Oral, resp. rate 18, height 5' 0.5" (1.537 m), weight 126 lb 9.6 oz (57.425 kg), last menstrual period 02/09/2005.    HEENT: No thrush or ulcers. Lymphatics: No palpable cervical, supraclavicular or axillary lymph nodes. Resp: Lungs clear bilaterally. Cardio: Regular rate and rhythm. GI: Abdomen soft and nontender. No organomegaly. Vascular: No leg edema.    Lab Results:  Lab Results  Component Value Date   WBC 6.7 03/31/2015   HGB 14.6 03/31/2015   HCT 42.6 03/31/2015   MCV 90.9 03/31/2015   PLT 145 03/31/2015   NEUTROABS 3.7 03/31/2015    Imaging:  No results found.  Medications: I have reviewed the patient's current medications.  Assessment/Plan: 1. Chronic ITP. 2. History of iron deficiency anemia. Resolved.   Disposition: Ms. Jenna Montoya appears stable. The hemoglobin and platelet count remain in normal range. She will return for a follow-up visit and labs in one year. She will contact the office in the interim with any problems.  Plan reviewed with Dr. Truett PernaSherrill.    Lonna Cobbhomas, Damauri Minion ANP/GNP-BC   03/31/2015  2:08 PM

## 2015-03-31 NOTE — Telephone Encounter (Signed)
Pt confirmed labs/ov per 07/05 POF, gave pt AVS and Calendar.... KJ °

## 2015-06-05 ENCOUNTER — Other Ambulatory Visit: Payer: Self-pay | Admitting: Gynecology

## 2015-06-05 ENCOUNTER — Encounter: Payer: Self-pay | Admitting: Gynecology

## 2015-06-05 DIAGNOSIS — E7800 Pure hypercholesterolemia, unspecified: Secondary | ICD-10-CM

## 2016-01-04 ENCOUNTER — Encounter: Payer: Self-pay | Admitting: Gynecology

## 2016-02-03 ENCOUNTER — Ambulatory Visit (INDEPENDENT_AMBULATORY_CARE_PROVIDER_SITE_OTHER): Payer: BLUE CROSS/BLUE SHIELD | Admitting: Gynecology

## 2016-02-03 ENCOUNTER — Encounter: Payer: Self-pay | Admitting: Gynecology

## 2016-02-03 VITALS — BP 120/72 | Ht 61.0 in | Wt 119.0 lb

## 2016-02-03 DIAGNOSIS — N760 Acute vaginitis: Secondary | ICD-10-CM

## 2016-02-03 DIAGNOSIS — Z01419 Encounter for gynecological examination (general) (routine) without abnormal findings: Secondary | ICD-10-CM | POA: Diagnosis not present

## 2016-02-03 DIAGNOSIS — R102 Pelvic and perineal pain: Secondary | ICD-10-CM

## 2016-02-03 DIAGNOSIS — N949 Unspecified condition associated with female genital organs and menstrual cycle: Secondary | ICD-10-CM | POA: Insufficient documentation

## 2016-02-03 DIAGNOSIS — A499 Bacterial infection, unspecified: Secondary | ICD-10-CM

## 2016-02-03 DIAGNOSIS — B9689 Other specified bacterial agents as the cause of diseases classified elsewhere: Secondary | ICD-10-CM

## 2016-02-03 DIAGNOSIS — N898 Other specified noninflammatory disorders of vagina: Secondary | ICD-10-CM | POA: Diagnosis not present

## 2016-02-03 LAB — WET PREP FOR TRICH, YEAST, CLUE
Trich, Wet Prep: NONE SEEN
Yeast Wet Prep HPF POC: NONE SEEN

## 2016-02-03 MED ORDER — METRONIDAZOLE 500 MG PO TABS: 500.0000 mg | ORAL_TABLET | Freq: Two times a day (BID) | ORAL | Status: DC

## 2016-02-03 NOTE — Patient Instructions (Addendum)
Vaginosis bacteriana (Bacterial Vaginosis) La vaginosis bacteriana es una infeccin vaginal que perturba el equilibrio normal de las bacterias que se encuentran en la vagina. Es el resultado de un crecimiento excesivo de ciertas bacterias. Esta es la infeccin vaginal ms frecuente en mujeres en edad reproductiva. El tratamiento es importante para prevenir complicaciones, especialmente en mujeres embarazadas, dado que puede causar un parto prematuro. CAUSAS  La vaginosis bacteriana se origina por un aumento de bacterias nocivas que, generalmente, estn presentes en cantidades ms pequeas en la vagina. Varios tipos diferentes de bacterias pueden causar esta afeccin. Sin embargo, la causa de su desarrollo no se comprende totalmente. [place] o comportamientos pueden exponerlo a un mayor riesgo de desarrollar vaginosis bacteriana, entre los que se incluyen:  Tener una nueva pareja sexual o mltiples parejas sexuales.  Las duchas vaginales  El uso del DIU (dispositivo intrauterino) como mtodo anticonceptivo. El contagio no se produce en baos, por ropas de cama, en piscinas o por contacto con objetos. SIGNOS Y SNTOMAS  Algunas mujeres que padecen vaginosis bacteriana no presentan signos ni sntomas. Los sntomas ms comunes son:  Secrecin vaginal de color grisceo.  Secrecin vaginal con olor similar al [employer], especialmente despus de Retail banker.  Picazn o sensacin de ardor en la vagina o la vulva.  Ardor o dolor al [employer]. DIAGNSTICO  Su mdico analizar su historia clnica y le examinar la vagina para detectar signos de vaginosis bacteriana. Puede tomarle Truddie Coco de flujo vaginal. Su mdico examinar esta muestra con un microscopio para controlar las bacterias y clulas anormales. Tambin puede realizarse un anlisis del pH vaginal.  TRATAMIENTO  La vaginosis bacteriana puede tratarse con antibiticos, en forma de comprimidos o  de crema vaginal. Puede indicarse una segunda tanda de antibiticos si la afeccin se repite despus del tratamiento. Debido a que la vaginosis bacteriana aumenta el riesgo de contraer enfermedades de transmisin sexual, el tratamiento puede ayudar a reducir el riesgo de clamidia, [place], VIH y herpes. [place] solo medicamentos de venta libre o recetados, segn las indicaciones del mdico.  Si le han recetado antibiticos, tmelos como se le indic. Asegrese de que finaliza la prescripcin completa aunque se sienta mejor.  Comunique a sus compaeros sexuales que sufre una infeccin vaginal. Deben consultar a su mdico y recibir tratamiento si tienen problemas, como picazn o una erupcin cutnea leve.  Durante el [place], es importante que siga estas indicaciones:  Visual merchandiser relaciones sexuales o use preservativos de la forma correcta.  No se haga duchas vaginales.  Evite consumir alcohol como se lo haya indicado el mdico.  Community education officer se lo haya indicado el mdico. SOLICITE ATENCIN MDICA SI:   Sus sntomas no mejoran despus de 3 das de [place].  Aumenta la secrecin o Conservation officer, historic buildings.  Tiene fiebre. ASEGRESE DE QUE:   Comprende estas instrucciones.  Controlar su afeccin.  Recibir ayuda de inmediato si no mejora o si empeora. PARA OBTENER MS INFORMACIN  Centros para el control y la prevencin de Probation officer for Disease Control and Prevention, CDC): AppraiserFraud.fi Asociacin Estadounidense de la Salud Sexual (American Sexual Health Association, SHA): www.ashastd.org    Esta informacin no tiene Marine scientist el consejo del mdico. Asegrese de hacerle al mdico cualquier pregunta que tenga.   Document Released: 12/20/2007 Document Revised: 10/03/2014 Elsevier Interactive Patient Education 2016 [employer]. Metronidazole tablets or capsules Qu es este medicamento? El METRONIDAZOL es un  antiinfeccioso.  Se utiliza en el tratamiento de ciertos tipos de infecciones bacterianas y por protozoos. No es efectivo para resfros, gripe u otras infecciones de origen viral. Este medicamento puede ser utilizado para otros usos; si tiene alguna pregunta consulte con su proveedor de atencin mdica o con su farmacutico. Qu le debo informar a mi profesional de la salud antes de tomar este medicamento? Necesita saber si usted presenta alguno de los siguientes problemas o situaciones: -anemia u otros trastornos sanguneos -enfermedad del sistema nervioso -infeccin mictica o por levadura -si consume bebidas alcohlicas -enfermedad heptica -convulsiones -una reaccin alrgica o inusual al metronidazol, a otros medicamentos, alimentos, colorantes o conservantes -si est embarazada o buscando quedar embarazada -si est amamantando a un beb Cmo debo utilizar este medicamento? Tome este medicamento por va oral con un vaso lleno de agua. Siga las instrucciones de la etiqueta del [place]. Tome sus dosis a intervalos regulares. No tome su medicamento con una frecuencia mayor a la indicada. Complete todo el tratamiento con el medicamento como recetado aun si se siente mejor. No omita ninguna dosis o suspenda el uso de su medicamento antes de lo indicado. Hable con su pediatra para informarse acerca del uso de este medicamento en nios. Puede requerir atencin especial. Sobredosis: Pngase en contacto inmediatamente con un centro toxicolgico o una sala de urgencia si usted cree que haya tomado demasiado medicamento. ATENCIN: [employer] es solo para usted. No comparta este medicamento con nadie. Qu sucede si me olvido de una dosis? Si olvida una dosis, tmela lo antes posible. Si es casi la hora de la prxima dosis, tome slo esa dosis. No tome dosis adicionales o dobles. Qu puede interactuar con este medicamento? No tome esta medicina con ninguno de los siguientes  medicamentos: -alcohol o cualquier producto que contiene alcohol -solucin oral de amprenavir -cisapride -disulfiram -dofetilida -dronedarona -inyeccin de paclitaxel -pimozida -solucin oral de ritonavir -solucin oral de sertralina -inyeccin de sulfametoxasol-trimetoprima -tioridazina -ziprasidona Esta medicina tambin puede interactuar con los siguientes medicamentos: -pldoras anticonceptivas -cimetidina -litio -otros medicamentos que prolongan el intervalo QT (provoca un ritmo cardiaco anormal) -fenobarbital -fenitona -warfarina Puede ser que esta lista no menciona todas las posibles interacciones. Informe a su profesional de [employer]	[place] de [employer] productos a base de hierbas, medicamentos de [place] o suplementos nutritivos que est tomando. Si usted fuma, consume bebidas alcohlicas o si utiliza drogas ilegales, indqueselo tambin a su profesional de [employer]	[place]. Algunas sustancias pueden interactuar con su medicamento. A qu debo estar atento al usar [employer]? Consulte a su mdico o a su profesional de la salud si sus sntomas no mejoran o si empeoran. Puede experimentar mareos o somnolencia. No conduzca ni utilice maquinaria ni haga nada que Associate Professor en estado de alerta hasta que sepa cmo le afecta este medicamento. No se siente ni se ponga de pie con rapidez, especialmente si es un paciente de edad avanzada. Esto reduce el riesgo de mareos o [employer]. Evite las bebidas alcohlicas durante el tratamiento con este medicamento y [employer] tres das siguientes. El alcohol puede causarle mareos o hacerlo sentir enfermo o ruborizado. Si est recibiendo tratamiento para una enfermedad de transmisin sexual, no tenga relaciones sexuales hasta que haya completado el [place]. Es posible que su pareja tambin necesite [place]. Qu efectos secundarios puedo tener al [employer] este medicamento? Efectos secundarios que debe informar a su mdico o a Ship broker de la salud tan pronto como sea posible: -reacciones alrgicas como erupcin cutnea o urticarias, hinchazn de la  cara, labios o lengua -confusin, torpeza -dificultad para hablar -mareos -decoloracin o dolor de la boca -fiebre, infeccin -entumecimiento, hormigueo, dolor o debilidad en las manos o los pies -dificultad para orinar o cambios en el volumen de orina -enrojecimiento, formacin de ampollas, descamacin o distensin de la piel, inclusive dentro de la boca -convulsiones -cansancio o debilidad inusual -irritacin, resequedad o flujo de la vagina Efectos secundarios que, por lo general, no requieren atencin mdica (debe informarlos a su mdico o a su profesional de la salud si persisten o si son molestos): -diarrea -dolor de cabeza -irritabilidad -sabor metlico -nuseas -calambres o dolores estomacales -dificultad para conciliar el sueo Puede ser que esta lista no menciona todos los posibles efectos secundarios. Comunquese a su mdico por asesoramiento mdico [employer]. Usted puede informar los efectos secundarios a la FDA por telfono al 1-800-FDA-1088. Dnde debo guardar mi medicina? Mantngala fuera del alcance de los nios. [place]dela a [employer], menos de 25 grados C (77 grados F). Protjala de la luz. Mantenga el envase bien cerrado. Deseche todo el medicamento que no haya utilizado, despus de la fecha de vencimiento. ATENCIN: Este folleto es un resumen. Puede ser que no cubra toda la posible informacin. Si usted tiene preguntas acerca de esta medicina, consulte con su mdico, su farmacutico o su profesional de Technical sales engineer.    2016, Elsevier/Gold Standard. (2014-11-04 00:00:00)

## 2016-02-03 NOTE — Progress Notes (Signed)
Jenna Montoya 03-Dec-1970 [id number]   History:    45 y.o.  for annual gyn exam as well as complaining of left lower quadrant pains on and off for the past 2 months. 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 Patient with no past history of any abnormal Pap smears prior to her hysterectomy.  Past medical history,surgical history, family history and social history were all reviewed and documented in the EPIC chart.  Gynecologic History Patient's last menstrual period was 02/09/2005. Contraception: status post hysterectomy Last Pap: 2014. Results were: normal Last mammogram: 2017. Results were: Normal 3-D  Obstetric History OB History  Gravida Para Term Preterm AB SAB TAB Ectopic Multiple Living  # Outcome Date GA Lbr Len/2nd Weight Sex Delivery Anes PTL Lv  4 Term     F CS-Unspec  N Y  3 Term     M Vag-Spont  N Y  2 Term     F Vag-Spont  N Y  1 Term     M Vag-Spont  N Y       ROS: A ROS was performed and pertinent positives and negatives are included in the history.  GENERAL: No fevers or chills. HEENT: No change in vision, no earache, sore throat or sinus congestion. NECK: No pain or stiffness. CARDIOVASCULAR: No chest pain or pressure. No palpitations. PULMONARY: No shortness of breath, cough or wheeze. GASTROINTESTINAL: No abdominal pain, nausea, vomiting or diarrhea, melena or bright red blood per rectum. GENITOURINARY: No urinary frequency, urgency, hesitancy or dysuria. MUSCULOSKELETAL: No joint or muscle pain, no back pain, no recent trauma. DERMATOLOGIC: No rash, no itching, no  lesions. ENDOCRINE: No polyuria, polydipsia, no heat or cold intolerance. No recent change in weight. HEMATOLOGICAL: No anemia or easy bruising or bleeding. NEUROLOGIC: No headache, seizures, numbness, tingling or weakness. PSYCHIATRIC: No depression, no loss of interest in normal activity or change in sleep pattern.     Exam: chaperone present  BP 120/72 mmHg  Ht  (1.549 m)  Wt 119 lb (53.978 kg)  BMI 22.50 kg/m2  LMP 02/09/2005  Body mass index is 22.5 kg/(m^2).  General appearance : Well developed well nourished female. No acute distress HEENT: Eyes: no retinal hemorrhage or exudates,  Neck supple, trachea midline, no carotid bruits, no thyroidmegaly Lungs: Clear to auscultation, no rhonchi or wheezes, or rib retractions  Heart: Regular rate and rhythm, no murmurs or gallops Breast:Examined in sitting and supine position were symmetrical in appearance, no palpable masses or tenderness,  no skin retraction, no nipple inversion, no nipple discharge, no skin discoloration, no axillary or supraclavicular lymphadenopathy Abdomen: no palpable masses or tenderness, no rebound or guarding Extremities: no edema or skin discoloration or tenderness  Pelvic:  Bartholin, Urethra, [name] Glands: Within normal limits             Vagina: Clear discharge  Cervix: Absent  Uterus  absent  Adnexa  left adnexal fullness and tenderness  Anus and perineum  normal   Rectovaginal  normal sphincter tone without palpated masses or tenderness  Hemoccult not indicated   Wet prep moderate clue cells, few WBC, too numerous to count bacteria  Assessment/Plan:  45 y.o. female for annual exam with clinical evidence of bacterial vaginosis will be treated with Flagyl 500 mg one by mouth twice a day for 7 days. Due to patient's left lower quadrant pains and fullness during exam she will return back to the office for pelvic ultrasound. Her PCP is been doing her blood work. Pap smear was done  today.   Ok EdwardsFERNANDEZ,JUAN H MD, 10:39 AM 02/03/2016

## 2016-02-04 LAB — PAP IG W/ RFLX HPV ASCU

## 2016-02-18 ENCOUNTER — Telehealth: Payer: Self-pay | Admitting: *Deleted

## 2016-02-18 MED ORDER — METOCLOPRAMIDE HCL 10 MG PO TABS: 10.0000 mg | ORAL_TABLET | Freq: Four times a day (QID) | ORAL | Status: DC | PRN

## 2016-02-18 NOTE — Telephone Encounter (Signed)
Claudia will inform pt. 

## 2016-02-18 NOTE — Telephone Encounter (Signed)
-----   Message from Jerilynn Mageslaudia Shaffer sent at 02/18/2016 12:54 PM EDT ----- Regarding: nurse call Patient took FLAGYL 500 MG last week and I made her dizzy and upset stomach. She already finish medicine but the upset stomach and nausea continues when she eats solid food. She wants to know if medicine cause it and what can she do about it.

## 2016-02-18 NOTE — Telephone Encounter (Signed)
Spanish speaking patient, please see the below.

## 2016-02-18 NOTE — Telephone Encounter (Signed)
That's good that she finishes the medication. However give her GI tract arrest for 24 hours by stain on clear liquids and soft foods. You can call in Reglan 10 mg to take 1 by mouth every 6 hours when necessary

## 2016-02-26 ENCOUNTER — Ambulatory Visit (INDEPENDENT_AMBULATORY_CARE_PROVIDER_SITE_OTHER): Payer: BLUE CROSS/BLUE SHIELD

## 2016-02-26 ENCOUNTER — Ambulatory Visit (INDEPENDENT_AMBULATORY_CARE_PROVIDER_SITE_OTHER): Payer: BLUE CROSS/BLUE SHIELD | Admitting: Gynecology

## 2016-02-26 ENCOUNTER — Encounter: Payer: Self-pay | Admitting: Gynecology

## 2016-02-26 VITALS — BP 130/84

## 2016-02-26 DIAGNOSIS — R102 Pelvic and perineal pain: Secondary | ICD-10-CM | POA: Diagnosis not present

## 2016-02-26 DIAGNOSIS — N940 Mittelschmerz: Secondary | ICD-10-CM | POA: Diagnosis not present

## 2016-02-26 DIAGNOSIS — N949 Unspecified condition associated with female genital organs and menstrual cycle: Secondary | ICD-10-CM | POA: Diagnosis not present

## 2016-02-26 NOTE — Progress Notes (Signed)
   HPI: Patient is a 45 year old who presented to the office today to discuss her ultrasound. She was seen the office for her annual exam on 02/03/2016 and had been complaining for 2 months of on and off left lower quadrant discomfort. She denied any dyspareunia. She denies any vaginal discharge and is in a monogamous relationship. She has had a prior total abdominal hysterectomy for symptomatic leiomyomatous uteri or by atypical leiomyoma but with no evidence of increased mitotic activity was reported by the pathologist. Patient's Pap smear was recently normal as well. She was asymptomatic today.   ROS: A ROS was performed and pertinent positives and negatives are included in the history.  GENERAL: No fevers or chills. HEENT: No change in vision, no earache, sore throat or sinus congestion. NECK: No pain or stiffness. CARDIOVASCULAR: No chest pain or pressure. No palpitations. PULMONARY: No shortness of breath, cough or wheeze. GASTROINTESTINAL: No abdominal pain, nausea, vomiting or diarrhea, melena or bright red blood per rectum. GENITOURINARY: No urinary frequency, urgency, hesitancy or dysuria. MUSCULOSKELETAL: No joint or muscle pain, no back pain, no recent trauma. DERMATOLOGIC: No rash, no itching, no lesions. ENDOCRINE: No polyuria, polydipsia, no heat or cold intolerance. No recent change in weight. HEMATOLOGICAL: No anemia or easy bruising or bleeding. NEUROLOGIC: No headache, seizures, numbness, tingling or weakness. PSYCHIATRIC: No depression, no loss of interest in normal activity or change in sleep pattern.   PE: Complete exam was done on office visit on May 10 see previous note for details   Ultrasound today: Absent uterus, right ovarian follicle 18 x 8 mm. Left ovary normal. No fluid in the cul-de-sac. No apparent adnexal masses.     Assessment Plan: It appears it patient's discomfort is attributed to mittelschmerz patient was reassured. She can take ibuprofen when necessary. Patient  has a scheduled appointment to follow-up with her medical oncologist this month as a result of her history of chronic ITP.    Greater than 50% of time was spent in counseling and coordinating care of this patient.   Time of consultation: 10   Minutes.

## 2016-02-26 NOTE — Patient Instructions (Signed)
Dolor intermenstrual (Mittelschmerz) El dolor intermenstrual se percibe en la parte baja del abdomen entre una menstruacin y la siguiente.  El dolor afecta un lado del abdomen. El lado que se ve afectado puede cambiar de un mes a Therapist, artotro.  El dolor puede ser leve o intenso  y durar minutos u horas. No dura ms de 1 o 2das.  El dolor puede presentarse con nuseas y un sangrado vaginal leve. El dolor intermenstrual es frecuente en las mujeres. Se debe a la maduracin y a Research scientist (medical)la liberacin de un vulo desde el ovario, y es una parte natural del ciclo ovulatorio. Suele ocurrir Citigroupunas dos semanas despus de la finalizacin de la [place]menstruacin. INSTRUCCIONES PARA EL CUIDADO EN EL HOGAR Est atenta a cualquier cambio en la afeccin. Tome estas medidas para aliviar el dolor:  Intente tomar un bao de inmersin con agua caliente.  Tome los medicamentos de venta libre y los recetados solamente como se lo haya indicado el mdico.  OceanographerConcurra a todas las visitas de control como se lo haya indicado el mdico. Esto es importante. SOLICITE ATENCIN MDICA SI:  El dolor es muy intenso casi todos los [place]meses.  El dolor abdominal dura ms de 24horas.  El medicamento no IT trainerle calma el dolor.  Tiene fiebre.  Tiene nuseas o vmitos que no desaparecen.  Falta del perodo menstrual.  Entre una menstruacin y la siguiente, el sangrado vaginal es ms abundante que un Mosbymanchado.   Esta informacin no tiene Theme park managercomo fin reemplazar el consejo del mdico. Asegrese de hacerle al mdico cualquier pregunta que tenga.   Document Released: 06/22/2005 Document Revised: 06/03/2015 Elsevier Interactive Patient Education [employer]2016 Elsevier Inc.

## 2016-03-31 ENCOUNTER — Other Ambulatory Visit (HOSPITAL_BASED_OUTPATIENT_CLINIC_OR_DEPARTMENT_OTHER): Payer: BLUE CROSS/BLUE SHIELD

## 2016-03-31 ENCOUNTER — Telehealth: Payer: Self-pay | Admitting: Oncology

## 2016-03-31 ENCOUNTER — Other Ambulatory Visit: Payer: Self-pay | Admitting: *Deleted

## 2016-03-31 ENCOUNTER — Ambulatory Visit (HOSPITAL_BASED_OUTPATIENT_CLINIC_OR_DEPARTMENT_OTHER): Payer: BLUE CROSS/BLUE SHIELD | Admitting: Oncology

## 2016-03-31 VITALS — BP 131/74 | HR 57 | Temp 99.0°F | Resp 20 | Ht 61.0 in | Wt 121.3 lb

## 2016-03-31 DIAGNOSIS — D693 Immune thrombocytopenic purpura: Secondary | ICD-10-CM | POA: Diagnosis not present

## 2016-03-31 LAB — CBC WITH DIFFERENTIAL/PLATELET
BASO%: 0.2 % (ref 0.0–2.0)
Basophils Absolute: 0 10*3/uL (ref 0.0–0.1)
EOS ABS: 0 10*3/uL (ref 0.0–0.5)
EOS%: 0.7 % (ref 0.0–7.0)
HEMATOCRIT: 41.3 % (ref 34.8–46.6)
HEMOGLOBIN: 14.3 g/dL (ref 11.6–15.9)
LYMPH%: 46.8 % (ref 14.0–49.7)
MCH: 31.1 pg (ref 25.1–34.0)
MCHC: 34.6 g/dL (ref 31.5–36.0)
MCV: 89.8 fL (ref 79.5–101.0)
MONO#: 0.3 10*3/uL (ref 0.1–0.9)
MONO%: 7.6 % (ref 0.0–14.0)
NEUT%: 44.7 % (ref 38.4–76.8)
NEUTROS ABS: 1.9 10*3/uL (ref 1.5–6.5)
PLATELETS: 142 10*3/uL — AB (ref 145–400)
RBC: 4.6 10*6/uL (ref 3.70–5.45)
RDW: 13 % (ref 11.2–14.5)
WBC: 4.2 10*3/uL (ref 3.9–10.3)
lymph#: 2 10*3/uL (ref 0.9–3.3)

## 2016-03-31 NOTE — Progress Notes (Signed)
  [facility] OFFICE PROGRESS NOTE   Diagnosis: ITP  INTERVAL HISTORY:   Jenna Montoya returns as scheduled. She feels well. She has a few bruises over the extremities. No other bleeding.  Objective:  Vital signs in last 24 hours:  Blood pressure 131/74, pulse 57, temperature 99 F (37.2 C), temperature source Oral, resp. rate 20, height 5\' 1"  (1.549 m), weight 121 lb 4.8 oz (55.021 kg), last menstrual period 02/09/2005, SpO2 100 %.    HEENT: Neck without mass Lymphatics: No cervical, supraclavicular, or axillar nodes Resp: Lungs clear bilaterally Cardio: Regular rate and rhythm GI: No hepatosplenomegaly, nontender Vascular: No leg edema  Skin: Small ecchymosis at the left upper arm    Lab Results:  Lab Results  Component Value Date   WBC 4.2 03/31/2016   HGB 14.3 03/31/2016   HCT 41.3 03/31/2016   MCV 89.8 03/31/2016   PLT 142* 03/31/2016   NEUTROABS 1.9 03/31/2016   Medications: I have reviewed the patient's current medications.  Assessment/Plan: 1. Chronic ITP. 2. History of iron deficiency anemia. Resolved.  Disposition:  The platelet count is in the low normal range. She will contact us for spontaneous bleeding or bruising. Jenna Montoya will return for an office visit and CBC in one year.  Thornton PapasSHERRILL, Kionna Brier, MD  03/31/2016  1:03 PM

## 2016-03-31 NOTE — Telephone Encounter (Signed)
Gave and printed appt sched and avs for pt for July 2018 °

## 2016-04-05 ENCOUNTER — Telehealth: Payer: Self-pay | Admitting: *Deleted

## 2016-04-05 NOTE — Telephone Encounter (Signed)
Notified pt it is OK to stop iron, per Dr. Truett PernaSherrill. She voiced understanding.

## 2016-04-05 NOTE — Telephone Encounter (Signed)
FYI "Someone from Hospital called me yesterday.  No message left."   No documentation of a call out to this patient.  Advised of 04-06-2017 annual F/U but she says she is already aware of this.  Will notify collaborative to call patient if needed.

## 2016-04-14 ENCOUNTER — Ambulatory Visit: Payer: BLUE CROSS/BLUE SHIELD | Admitting: Gynecology

## 2016-04-15 ENCOUNTER — Encounter: Payer: Self-pay | Admitting: Gynecology

## 2016-04-15 ENCOUNTER — Ambulatory Visit (INDEPENDENT_AMBULATORY_CARE_PROVIDER_SITE_OTHER): Payer: BLUE CROSS/BLUE SHIELD | Admitting: Gynecology

## 2016-04-15 VITALS — BP 118/76

## 2016-04-15 DIAGNOSIS — N898 Other specified noninflammatory disorders of vagina: Secondary | ICD-10-CM | POA: Diagnosis not present

## 2016-04-15 DIAGNOSIS — N760 Acute vaginitis: Secondary | ICD-10-CM

## 2016-04-15 DIAGNOSIS — A499 Bacterial infection, unspecified: Secondary | ICD-10-CM

## 2016-04-15 DIAGNOSIS — B9689 Other specified bacterial agents as the cause of diseases classified elsewhere: Secondary | ICD-10-CM

## 2016-04-15 LAB — WET PREP FOR TRICH, YEAST, CLUE
Trich, Wet Prep: NONE SEEN
WBC WET PREP: NONE SEEN
YEAST WET PREP: NONE SEEN

## 2016-04-15 NOTE — Progress Notes (Signed)
   HPI: Patient is a [age] year old was seen the office for her annual exam in May of this year. She was having slight discharge and was treated for bacterial vaginosis with Flagyl 500 mg twice a day for 7 days. She states that it has almost gone completely but still has slowly you was discharge with a slight fishy odor. She denies any vulvar pruritus. No GU or GI complaints. Patient denies any fever, chills, nausea, vomiting and no change in appetite.  ROS: A ROS was performed and pertinent positives and negatives are included in the history.  GENERAL: No fevers or chills. HEENT: No change in vision, no earache, sore throat or sinus congestion. NECK: No pain or stiffness. CARDIOVASCULAR: No chest pain or pressure. No palpitations. PULMONARY: No shortness of breath, cough or wheeze. GASTROINTESTINAL: No abdominal pain, nausea, vomiting or diarrhea, melena or bright red blood per rectum. GENITOURINARY: No urinary frequency, urgency, hesitancy or dysuria. MUSCULOSKELETAL: No joint or muscle pain, no back pain, no recent trauma. DERMATOLOGIC: No rash, no itching, no lesions. ENDOCRINE: No polyuria, polydipsia, no heat or cold intolerance. No recent change in weight. HEMATOLOGICAL: No anemia or easy bruising or bleeding. NEUROLOGIC: No headache, seizures, numbness, tingling or weakness. PSYCHIATRIC: No depression, no loss of interest in normal activity or change in sleep pattern.   PE: Blood pressure 118/76 Gen. appearance: Well-developed well-nourished female with the above-mentioned complaint Pelvic: Bartholin urethra [name] was within normal limits Vagina: No gross lesions on inspection vaginal mucosa intact Bimanual exam: Not done Rectal exam: Not done  Wet prep: Moderate clue cells and too numerous to count bacteria    Assessment Plan: Patient with bacterial vaginosis. I explained to the patient that 30% of patients have recurrent bacterial vaginosis within the first 12 months. Since she was treated  with Flagyl 2 months ago I'm going to treat her with boric acid vaginal suppository 600 mg intravaginally for 21 days.    Greater than 50% of time was spent in counseling and coordinating care of this patient.   Time of consultation: 15   Minutes.

## 2016-04-15 NOTE — Patient Instructions (Signed)
Vaginosis bacteriana  (Bacterial Vaginosis)  La vaginosis bacteriana es una infección vaginal que perturba el equilibrio normal de las bacterias que se encuentran en la vagina. Es el resultado de un crecimiento excesivo de ciertas bacterias. Esta es la infección vaginal más frecuente en mujeres en edad reproductiva. El tratamiento es importante para prevenir complicaciones, especialmente en mujeres embarazadas, dado que puede causar un parto prematuro.  CAUSAS   La vaginosis bacteriana se origina por un aumento de bacterias nocivas que, generalmente, están presentes en cantidades más pequeñas en la vagina. Varios tipos diferentes de bacterias pueden causar esta afección. Sin embargo, la causa de su desarrollo no se comprende totalmente.  FACTORES DE RIESGO  Ciertas actividades o comportamientos pueden exponerlo a un mayor riesgo de desarrollar vaginosis bacteriana, entre los que se incluyen:  · Tener una nueva pareja sexual o múltiples parejas sexuales.  · Las duchas vaginales  · El uso del DIU (dispositivo intrauterino) como método anticonceptivo.  El contagio no se produce en baños, por ropas de cama, en piscinas o por contacto con objetos.  SIGNOS Y SÍNTOMAS   Algunas mujeres que padecen vaginosis bacteriana no presentan signos ni síntomas. Los síntomas más comunes son:  · Secreción vaginal de color grisáceo.  · Secreción vaginal con olor similar al pescado, especialmente después de mantener relaciones sexuales.  · Picazón o sensación de ardor en la vagina o la vulva.  · Ardor o dolor al orinar.  DIAGNÓSTICO   Su médico analizará su historia clínica y le examinará la vagina para detectar signos de vaginosis bacteriana. Puede tomarle una muestra de flujo vaginal. Su médico examinará esta muestra con un microscopio para controlar las bacterias y células anormales. También puede realizarse un análisis del pH vaginal.   TRATAMIENTO   La vaginosis bacteriana puede tratarse con antibióticos, en forma de comprimidos o  de crema vaginal. Puede indicarse una segunda tanda de antibióticos si la afección se repite después del tratamiento. Debido a que la vaginosis bacteriana aumenta el riesgo de contraer enfermedades de transmisión sexual, el tratamiento puede ayudar a reducir el riesgo de clamidia, gonorrea, VIH y herpes.  INSTRUCCIONES PARA EL CUIDADO EN EL HOGAR   · Tome solo medicamentos de venta libre o recetados, según las indicaciones del médico.  · Si le han recetado antibióticos, tómelos como se le indicó. Asegúrese de que finaliza la prescripción completa aunque se sienta mejor.  · Comunique a sus compañeros sexuales que sufre una infección vaginal. Deben consultar a su médico y recibir tratamiento si tienen problemas, como picazón o una erupción cutánea leve.  · Durante el tratamiento, es importante que siga estas indicaciones:    Evite mantener relaciones sexuales o use preservativos de la forma correcta.    No se haga duchas vaginales.    Evite consumir alcohol como se lo haya indicado el médico.    Evite amamantar como se lo haya indicado el médico.  SOLICITE ATENCIÓN MÉDICA SI:   · Sus síntomas no mejoran después de 3 días de tratamiento.  · Aumenta la secreción o el dolor.  · Tiene fiebre.  ASEGÚRESE DE QUE:   · Comprende estas instrucciones.  · Controlará su afección.  · Recibirá ayuda de inmediato si no mejora o si empeora.  PARA OBTENER MÁS INFORMACIÓN   Centros para el control y la prevención de enfermedades (Centers for Disease Control and Prevention, CDC): www.cdc.gov/std  Asociación Estadounidense de la Salud Sexual (American Sexual Health Association, SHA): www.ashastd.org      Esta información no   tiene como fin reemplazar el consejo del médico. Asegúrese de hacerle al médico cualquier pregunta que tenga.     Document Released: 12/20/2007 Document Revised: 10/03/2014  Elsevier Interactive Patient Education ©2016 Elsevier Inc.

## 2016-04-29 ENCOUNTER — Ambulatory Visit: Payer: Worker's Compensation

## 2016-04-29 ENCOUNTER — Ambulatory Visit (INDEPENDENT_AMBULATORY_CARE_PROVIDER_SITE_OTHER): Payer: Worker's Compensation | Admitting: Urgent Care

## 2016-04-29 VITALS — BP 126/80 | HR 60 | Temp 98.0°F | Resp 16 | Ht 61.0 in | Wt 123.0 lb

## 2016-04-29 DIAGNOSIS — M25522 Pain in left elbow: Secondary | ICD-10-CM

## 2016-04-29 DIAGNOSIS — Z0283 Encounter for blood-alcohol and blood-drug test: Secondary | ICD-10-CM

## 2016-04-29 DIAGNOSIS — M7712 Lateral epicondylitis, left elbow: Secondary | ICD-10-CM | POA: Diagnosis not present

## 2016-04-29 DIAGNOSIS — Z026 Encounter for examination for insurance purposes: Secondary | ICD-10-CM

## 2016-04-29 MED ORDER — NAPROXEN SODIUM 550 MG PO TABS: 550.0000 mg | ORAL_TABLET | Freq: Two times a day (BID) | ORAL | 1 refills | Status: DC

## 2016-04-29 NOTE — Patient Instructions (Addendum)
Epicondilitis lateral con rehabilitacin (Lateral Epicondylitis With Rehab) La epicondilitis lateral consiste en la inflamacin y dolor alrededor de la regin externa del codo. El dolor tiene su origen en la inflamacin de los tendones del antebrazo que extienden la [place]. La epicondilitis lateral, tambin es denominada codo de [place] debido a que es muy frecuente [employer] jugadores de tenis. Sin embargo, Nurse, children's individuo que extienda la mueca de [place] repetitiva. Si esta afeccin no se trata, puede transformarse en un problema crnico. SNTOMAS  Dolor y sensibilidad e inflamacin en la zona externa (lateral) del codo.  Dolor o debilidad al tomar [employer].  Dolor que [employer] con los movimientos de rotacin de la mueca (jugar al tenis, usar un Engineer, maintenance, abrir una puerta o un frasco).  Dolor al levantar objetos, inclusive [place] taza de caf. CAUSAS  La causa de la epicondilitis lateral es la inflamacin de los tendones que extienden la [place]. Entre las causas se incluyen:  Librarian, academic repetitivo y distensin de los msculos y los tendones que extienden la [place].  Cambio repentino en el nivel o intensidad de la [place].  Agarre incorrecto en los deportes con raqueta.  Tamao incorrecto del puo de la raqueta (con frecuencia demasiado grande).  Posicin o tcnica incorrecta al [employer] un golpe (generalmente con el dorso de la mano; llevada por el codo).  Utilizar una raqueta demasiado pesada. LOS RIESGOS AUMENTAN CON:  Los deportes u ocupaciones que requieren movimientos repetitivos y extenuantes del antebrazo y la [place] (tenis, squash, deportes con raqueta, carpintera).  Poca fuerza y flexibilidad de la [place] y [place].  Precalentamiento y elongacin inadecuados antes de la [place].  Regreso a la actividad antes de [employer] curacin, la rehabilitacin y Public relations account executive. PREVENCIN  Precalentamiento adecuado y elongacin antes de la  [place].  Mantener la forma fsica:  [place], flexibilidad y resistencia muscular.  Capacidad cardiovascular.  Utilice un equipo que le ajuste adecuadamente.  Usar la tcnica correcta y Warehouse manager un entrenador que corrija la tcnica incorrecta.  Use un vendaje para el codo apropiado para el tenis (contrafuerza). PRONSTICO El curso de la enfermedad depende del grado de la lesin. Si se trata adecuadamente, los casos agudos (sntomas que duran menos de 4 semanas) generalmente se resuelven en un perodo de 2 a 6 semanas. Los casos crnicos (que duran ms tiempo) se resuelven en un lapso de 3 a 6 meses, pero pueden requerir de tratamiento fisioteraputico. COMPLICACIONES RELACIONADAS  La recurrencia frecuente de los sntomas puede dar como resultado un problema crnico. Un tratamiento adecuado en su inicio disminuye la probabilidad de recurrencias.  La inflamacin crnica, degeneracin cicatrizal del tendn y ruptura parcial del tendn, requieren [place].  Demora de la curacin o de la resolucin de los sntomas. TRATAMIENTO El tratamiento inicial consiste en la toma de medicamentos y la aplicacin de hielo para Engineer, materials y reducir la hinchazn. Los ejercicios de elongacin y fortalecimiento pueden ayudar a reducir las molestias si se realizan con regularidad. Podr realizar estos ejercicios en su casa, si se trata de una afeccin aguda. Los casos crnicos pueden requerir la derivacin a un fisioterapeuta para Magazine features editor evaluacin y Dentist. Su mdico podr indicarle inyecciones con corticoides para reducir la inflamacin. Raras veces es necesario someterse a [place]. MEDICAMENTOS   Si es necesaria la administracin de medicamentos para Chief Technology Officer, se recomiendan los antiinflamatorios no esteroides, (aspirina e ibuprofeno) u otros calmantes menores (acetaminofeno).  No tome medicamentos para el dolor dentro de los 7  das previos a la [place].  El profesional podr  prescribirle calmantes si lo considera necesario. Utilcelos como se le indique y slo cuando lo necesite.  Se podrn recomendar inyecciones de corticoesteorides. Estas inyecciones deben reservarse para los [place] graves, porque slo se pueden administrar una determinada cantidad de veces. CALOR Y FRO  El fro debe aplicarse durante 10 a 15 minutos cada 2  3 horas para reducir la inflamacin y Chief Technology Officer e inmediatamente despus de cualquier actividad que agrava los sntomas. Utilice bolsas o un masaje de hielo.  El calor puede usarse antes de Therapist, music y de las actividades de fortalecimiento indicadas por el profesional, le fisioterapeuta o Orthoptist. Utilice una bolsa trmica o un pao hmedo. SOLICITE ATENCIN MDICA SI: Los sntomas empeoran o no mejoran en 2 semanas, a pesar de Medical illustrator. EJERCICIOS EJERCICIOS DE AMPLITUD DE MOVIMIENTOS Y ELONGACIN - Epicondilitis lateral (codo de [place]) Estos ejercicios le ayudarn en la recuperacin de la lesin. Los sntomas podrn desaparecer con o sin mayor intervencin del profesional, el fisioterapeuta o Orthoptist. Al completar estos ejercicios, recuerde:   Restaurar la flexibilidad del tejido ayuda a que las articulaciones recuperen el movimiento normal. Esto permite que el movimiento y la actividad sea ms saludables y menos dolorosos.  Para que sea efectiva, cada elongacin debe realizarse durante al menos 30 segundos.  La elongacin nunca debe ser dolorosa. Deber sentir slo un alargamiento suave o elongacin del tejido que estira. AMPLITUD DE MOVIMIENTOS - Flexin de la [place], asistida  Extienda el codo derecha / izquierdo con los dedos apuntando [place] abajo.*  Tire suavemente la palma de la mano hacia usted, hasta que sienta un ligero estiramiento de la parte superior del [place].  Mantenga esta posicin durante __________ segundos. Reptalo __________ veces. Realice este estiramiento __________ Anthoney Harada por da.   *Realice este ejercicio con el codo flexionado en lugar de extendido si el mdico, fisioterapeuta o entrenador se lo indican. AMPLITUD DE MOVIMIENTOS - Flexin de la [place], asistida  Extienda el codo derecha / izquierdo con la palma [place] arriba.*  Tire suavemente la palma y la punta de los dedos [place] atrs, para que la [place] se extienda y los dedos apunten hacia el suelo.  Debe sentir un ligero estiramiento en la parte interior del antebrazo.  Mantenga esta posicin durante __________segundos. Reptalo __________ veces. Realice este estiramiento __________ Anthoney Harada por da. *Realice este ejercicio con el codo flexionado en lugar de extendido si el mdico, fisioterapeuta o entrenador se lo indican. FUERZA - Flexin de la [employer] la palma de la mano derecha / izquierdo plana sobre una mesa, con el codo ligeramente doblado. Los dedos deben apuntar hacia el lado contrario del cuerpo.  Presione suavamente la parte de atrs de la mano en la mesa y enderece el codo. Debe sentir un ligero estiramiento en la parte superior del antebrazo.  Mantenga esta posicin durante __________ segundos. Reptalo __________ veces. Realice este estiramiento __________ Anthoney Harada por da.  FUERZA - Extensin de la [employer] puntas de los dedos de la mano derecha / izquierdo plana sobre una mesa, con el codo ligeramente doblado. Los dedos deben apuntar [employer].  Presione suavamente los dedos y la mano en la mesa y enderece el codo. Debe sentir un ligero estiramiento en la parte interna del antebrazo.  Mantenga esta posicin durante __________ segundos. Reptalo __________ veces. Realice este estiramiento __________ Anthoney Harada por da.  EJERCICIOS DE FORTALECIMIENTO - Epicondilitis lateral (codo de [place]) Estos ejercicios le  ayudarn en la recuperacin de la lesin. Los sntomas podrn desaparecer con o sin mayor intervencin del profesional, el fisioterapeuta o Orthoptist. Al completar estos  ejercicios, recuerde:   Los msculos pueden ganar tanto la resistencia como la fuerza que necesita para sus actividades diarias a travs de ejercicios controlados.  Realice los ejercicios como se lo indic el mdico, el fisioterapeuta o Orthoptist. Aumente la resistencia y repeticiones segn se le haya indicado.  Podr experimentar dolor o cansancio muscular, pero el dolor o molestia que trata de eliminar a travs de los ejercicios nunca debe empeorar. Si el dolor empeora, detngase y asegrese de que est siguiendo las directivas correctamente. Si an siente dolor luego de Education officer, environmental lo ajustes necesarios, deber discontinuar el ejercicio hasta que pueda conversar con el profesional sobre el problema. FUERZA - Flexores de la mueca  Sintese con el antebrazo derecha / izquierdo con la palma hacia arriba y completamente apoyado sobre una mesa o [place]. El codo Scientist, research (physical sciences) en reposo y a la altura de los hombros. Haga que la [place] se extienda sobre los extremos de la superficie.  Sostenga sin apretar un peso de __________, Cheron Schaumann goma o tubo para ejercicios en ambas manos, y doble lentamente la mano hacia el [place].  Mantenga esta posicin durante __________ segundos. Baje lentamente la [employer] posicin inicial, de forma controlada. Reptalo __________ veces. Realice este estiramiento __________ Anthoney Harada por da.  FUERZA - Extensores de la mueca  Sintese con el antebrazo derecha / izquierdo con la palma hacia abajo y completamente apoyado sobre una mesa o [place]. El codo Scientist, research (physical sciences) en reposo y a la altura de los hombros. Haga que la [place] se extienda sobre los extremos de la superficie.  Sostenga sin apretar un peso de __________, Cheron Schaumann goma o tubo para ejercicios en ambas manos, y doble lentamente la mano hacia el [place].  Mantenga esta posicin durante __________ segundos. Baje lentamente la [employer] posicin inicial, de forma controlada. Reptalo __________ veces. Realice  este estiramiento __________ Anthoney Harada por da.  FUERZA - Desviacin ulnar  Prese sosteniendo un peso de ____________________ en su mano derecha / izquierdo, o sintese sosteniendo una banda de goma para ejercicios, con el brazo sano apoyado en una mesa o mesada.  Mueva la [place], para que el dedo meique apunte hacia el antebrazo y Multimedia programmer en contra del mismo.  Mantenga esta posicin durante __________ segundos y luego lentamente baje la mueca a la posicin inicial. Reptalo __________ veces. Realice este ejercicio __________ veces por da. FUERZA - Desviacin radial  Prese sosteniendo un peso de ____________________ en su mano derecha / izquierdo, o sintese sosteniendo una banda de goma para ejercicios, con el brazo lesionado apoyado en una mesa o [place].  Eleve la mano hacia arriba, por delante suyo o tire [place] arriba la banda de [place].  Mantenga esta posicin durante __________ segundos y luego lentamente baje la mueca a la posicin inicial. Reptalo __________ veces. Realice este estiramiento __________ Anthoney Harada por da. FUERZA - Supinadores del antebrazo  Sintese con su antebrazo derecha / izquierdo apoyado sobre una mesa, manteniendo el codo por debajo de la altura del hombro. Apoye la [employer] borde, con la palma [place].  Suavemente tome un martillo o un cucharn de sopa.  Sin mover el codo, gire lentamente la palma y la mano hacia arriba para colocar el "pulgar arriba".  Mantenga esta posicin durante __________ segundos. Vuelva lentamente a la posicin inicial. Reptalo __________ veces.  Realice este estiramiento __________ Anthoney Harada por da.  FUERZA - Pronadores del antebrazo  Sintese con su antebrazo derecha / izquierdo apoyado sobre una mesa, manteniendo el codo por debajo de la altura del hombro. Apoye la [employer] borde, con la palma [place].  Suavemente tome un martillo o un cucharn de sopa.  Sin mover el codo, gire lentamente la palma y la mano  hacia arriba para colocar el "pulgar arriba".  Mantenga esta posicin durante __________ segundos. Vuelva lentamente a la posicin inicial. Reptalo __________ veces. Realice este estiramiento __________ Anthoney Harada por da.  FUERZA - Agarre  [employer] pelota de tenis, una esponja dura o una media larga y [place].  Apritela lo ms fuerte que pueda, sin Tax inspector.  Mantenga esta posicin durante __________ segundos. Sultela lentamente. Reptalo __________ veces. Realice este estiramiento __________ Anthoney Harada por da.  FUERZA - Extensores del codo, isomtrica  Prese o sintese erguido sobre una superficie firme. Coloque su brazo Sales executive / izquierdo de modo que la palma de su mano quede frente al [place] y a la altura de su cintura.  Coloque la mano opuesta sobre lado inferior del [place]. Empuje suavemente hacia arriba mientras su brazo derecha / izquierdo opone resistencia. Empuje tan intensamente como pueda con ambos brazos sin causar Scientist, research (medical) ni realizar movimientos con su codo derecha / izquierdo. Mantenga esta posicin durante __________ segundos. Libere la tensin de ambos brazos gradualmente. Permita que sus msculos se relajen completamente antes de repetir.   Esta informacin no tiene Theme park manager el consejo del mdico. Asegrese de hacerle al mdico cualquier pregunta que tenga.   Document Released: 06/29/2006 Document Revised: 01/27/2015 Elsevier Interactive Patient Education [employer]     IF you received an x-ray today, you will receive an invoice from [facility]. Please contact [facility] at [phone] with questions or concerns regarding your invoice.   IF you received labwork today, you will receive an invoice from [employer]. Please contact [facility] at [phone] with questions or concerns regarding your invoice.   Our billing staff will not be able to assist you with questions regarding bills  from these companies.  You will be contacted with the lab results as soon as they are available. The fastest way to get your results is to activate your My Chart account. Instructions are located on the last page of this paperwork. If you have not heard from [place] regarding the results in 2 weeks, please contact this office.

## 2016-04-29 NOTE — Progress Notes (Signed)
    MRN: [id number] DOB: July 30, 1971  Subjective:   Jenna Montoya is a 45 y.o. female presenting for worker's comp visit.   Reports left elbow injury that occurred at work ~5 weeks ago. 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 Patient reports that her co-worker accidentally swung the shovel into her left elbow. Since then, patient has ongoing intermittent left elbow pain, has difficulty with lifting heavy objects, carrying them, also had a bruise over her left elbow which has since resolved. She notified the owner ~2 weeks ago and was given a wrap. She has not worn the wrap consistently. Has not tried any medications for relief. Denies fever, redness, swelling, weakness, numbness or tingling.   Jenna Montoya's medications list, allergies, past medical history and past surgical history were reviewed and excluded from this note due to being a worker's comp case.  Objective:   Vitals: BP 126/80 (BP Location: Right Arm, Patient Position: Sitting, Cuff Size: Normal)   Pulse 60   Temp 98 F (36.7 C) (Oral)   Resp 16   Ht 5\' 1"  (1.549 m)   Wt 123 lb (55.8 kg)   LMP 02/09/2005   SpO2 98%   BMI 23.24 kg/m   Physical Exam  Constitutional: She is oriented to person, place, and time. She appears well-developed and well-nourished.  Cardiovascular: Normal rate.   Pulmonary/Chest: Effort normal.  Musculoskeletal:       Left elbow: She exhibits normal range of motion, no swelling, no effusion, no deformity and no laceration. Tenderness found. Lateral epicondyle tenderness noted. No radial head, no medial epicondyle and no olecranon process tenderness noted.  Marked pain at left elbow with resisted pronation and supination.  Neurological: She is alert and oriented to person, place, and time.  Skin: Skin is warm and dry.   Dg Elbow Complete Left (3+view)  Result Date: 04/29/2016 CLINICAL  DATA:  Left elbow injury and pain EXAM: LEFT ELBOW - COMPLETE 3+ VIEW COMPARISON:  None. FINDINGS: There is no evidence of fracture, dislocation, or joint effusion. There is no evidence of arthropathy or other focal bone abnormality. Soft tissues are unremarkable. IMPRESSION: Negative. Electronically Signed   By: Marlan Palau M.D.   On: 04/29/2016 09:27     Assessment and Plan :   1. Encounter related to worker's compensation claim 2. Lateral epicondylitis, left 3. Left elbow pain - Will start conservative management with NSAIDs, elbow strap. Work restrictions provided. Patient to follow up in 2 weeks.  Wallis Bamberg, PA-C [facility] [phone] 04/29/2016 8:38 AM

## 2016-05-10 ENCOUNTER — Ambulatory Visit (INDEPENDENT_AMBULATORY_CARE_PROVIDER_SITE_OTHER): Payer: Worker's Compensation | Admitting: Physician Assistant

## 2016-05-10 VITALS — BP 110/80 | HR 54 | Temp 98.4°F | Ht 61.0 in | Wt 122.4 lb

## 2016-05-10 DIAGNOSIS — M7712 Lateral epicondylitis, left elbow: Secondary | ICD-10-CM

## 2016-05-10 DIAGNOSIS — M25522 Pain in left elbow: Secondary | ICD-10-CM

## 2016-05-10 NOTE — Progress Notes (Signed)
MRN: [id number] DOB: 04/03/1971  Subjective:  Pt presents to clinic with an injury that occurred at work on 03/28/2016.  She has been working light duty since her visit 8/4 as well as wearing a tennis elbow strap.  The pain is much better.  She notes that she still has pain with certain movements.  She has been using the Naproxen and ice.  Review of Systems  Musculoskeletal: Negative for joint swelling.    Patients medications, problem list and allergies reviewed. Patients social, past and surgical history is reviewed.  Objective:  BP 110/80 (BP Location: Right Arm, Patient Position: Sitting, Cuff Size: Normal)   Pulse (!) 54   Temp 98.4 F (36.9 C) (Oral)   Ht 5\' 1"  (1.549 m)   Wt 122 lb 6.4 oz (55.5 kg)   LMP 02/09/2005   BMI 23.13 kg/m   Physical Exam  Constitutional: She is oriented to person, place, and time and well-developed, well-nourished, and in no distress.  HENT:  Head: Normocephalic and atraumatic.  Right Ear: Hearing and external ear normal.  Left Ear: Hearing and external ear normal.  Eyes: Conjunctivae are normal.  Neck: Normal range of motion.  Pulmonary/Chest: Effort normal.  Musculoskeletal:       Right elbow: Normal.      Left elbow: She exhibits normal range of motion and no swelling. Tenderness found. Lateral epicondyle tenderness noted. No medial epicondyle tenderness noted.  Pain with wrist extension that increases with resistance - application of strap alleviates her discomfort.  No pain with wrist flexion or pronation.  Pain with supination that increase with resistance that is alleviated with placement of strap.  Neurological: She is alert and oriented to person, place, and time. Gait normal.  Skin: Skin is warm and dry.  Psychiatric: Mood, memory, affect and judgment normal.  Vitals reviewed.   Assessment and Plan :  Lateral epicondylitis, left  Left elbow pain  F/u would typically be at 2 week mark since patient is making such good progress but  [place] - she will be gone for 1 month to help care for her mother so the plan will be to recheck upon her return to [place].  She will continue with the strap and ice massage and Naproxen.  Benny LennertSarah Weber PA-C  [facility] 05/10/2016 7:20 PM

## 2016-06-17 ENCOUNTER — Encounter: Payer: Self-pay | Admitting: Gynecology

## 2016-07-14 ENCOUNTER — Encounter: Payer: Self-pay | Admitting: Gynecology

## 2016-07-14 ENCOUNTER — Ambulatory Visit (INDEPENDENT_AMBULATORY_CARE_PROVIDER_SITE_OTHER): Payer: BLUE CROSS/BLUE SHIELD | Admitting: Gynecology

## 2016-07-14 VITALS — BP 120/72

## 2016-07-14 DIAGNOSIS — N898 Other specified noninflammatory disorders of vagina: Secondary | ICD-10-CM

## 2016-07-14 DIAGNOSIS — Z113 Encounter for screening for infections with a predominantly sexual mode of transmission: Secondary | ICD-10-CM

## 2016-07-14 DIAGNOSIS — B9689 Other specified bacterial agents as the cause of diseases classified elsewhere: Secondary | ICD-10-CM

## 2016-07-14 DIAGNOSIS — N760 Acute vaginitis: Secondary | ICD-10-CM | POA: Diagnosis not present

## 2016-07-14 LAB — WET PREP FOR TRICH, YEAST, CLUE
Trich, Wet Prep: NONE SEEN
Yeast Wet Prep HPF POC: NONE SEEN

## 2016-07-14 MED ORDER — CLINDAMYCIN PHOSPHATE 2 % VA CREA: TOPICAL_CREAM | VAGINAL | 6 refills | Status: DC

## 2016-07-14 NOTE — Progress Notes (Signed)
   Patient is a 45 year old that presented to the office complaining of vaginal discharge. Patient has been treated back in May and July for bacterial vaginosis for she was treated with Flagyl 500 mg for 7 days then afterwards she was treated with boric acid vaginal suppositories 600 mg intravaginally for 21 days straight. She is in a monogamous relationship and has a prior hysterectomy.  Exam: Abdomen: Soft nontender no rebound or guarding Pelvic: Bartholin urethra [name] was within normal limits Vagina Fish she clear discharge vaginal cuff intact Bimanual exam not done Rectal exam not done  Wet prep few clue cells, 6-10 WBC and many bacteria  GC and Chlamydia culture pending  Assessment/plan: Patient with recurrent bacterial vaginosis will be prescribed long-term treatment with Cleocin vaginal cream she will first apply daily at bedtime for 7 days then for the next 4-6 months applied intravaginally twice a week. I'm also recommending an oral probiotic tablet such as refresh to take 1 daily.  Greater than 50%, spent counseling quantity care of of this patient time of consultation 15 minutes

## 2016-07-14 NOTE — Patient Instructions (Signed)
Vaginosis bacteriana (Bacterial Vaginosis) La vaginosis bacteriana es una infeccin vaginal que perturba el equilibrio normal de las bacterias que se encuentran en la vagina. Es el resultado de un crecimiento excesivo de ciertas bacterias. Esta es la infeccin vaginal ms frecuente en mujeres en edad reproductiva. El tratamiento es importante para prevenir complicaciones, especialmente en mujeres embarazadas, dado que puede causar un parto prematuro. CAUSAS  La vaginosis bacteriana se origina por un aumento de bacterias nocivas que, generalmente, estn presentes en cantidades ms pequeas en la vagina. Varios tipos diferentes de bacterias pueden causar esta afeccin. Sin embargo, la causa de su desarrollo no se comprende totalmente. FACTORES DE RIESGO Ciertas actividades o comportamientos pueden exponerlo a un mayor riesgo de desarrollar vaginosis bacteriana, entre los que se incluyen:  Tener una nueva pareja sexual o mltiples parejas sexuales.  Las duchas vaginales  El uso del DIU (dispositivo intrauterino) como mtodo anticonceptivo. El contagio no se produce en baos, por ropas de cama, en piscinas o por contacto con objetos. SIGNOS Y SNTOMAS  Algunas mujeres que padecen vaginosis bacteriana no presentan signos ni sntomas. Los sntomas ms comunes son:  Secrecin vaginal de color grisceo.  Secrecin vaginal con olor similar al [employer], especialmente despus de Sales promotion account executive.  Picazn o sensacin de ardor en la vagina o la vulva.  Ardor o dolor al [employer]. DIAGNSTICO  Su mdico analizar su historia clnica y le examinar la vagina para detectar signos de vaginosis bacteriana. Puede tomarle Lauris Poag de flujo vaginal. Su mdico examinar esta muestra con un microscopio para controlar las bacterias y clulas anormales. Tambin puede realizarse un anlisis del pH vaginal.  TRATAMIENTO  La vaginosis bacteriana puede tratarse con antibiticos, en forma de comprimidos o  de crema vaginal. Puede indicarse una segunda tanda de antibiticos si la afeccin se repite despus del tratamiento. Debido a que la vaginosis bacteriana aumenta el riesgo de contraer enfermedades de transmisin sexual, el tratamiento puede ayudar a reducir el riesgo de clamidia, Henderson, VIH y herpes. INSTRUCCIONES PARA EL CUIDADO EN EL HOGAR   Tome solo medicamentos de venta libre o recetados, segn las indicaciones del mdico.  Si le han recetado antibiticos, tmelos como se le indic. Asegrese de que finaliza la prescripcin completa aunque se sienta mejor.  Comunique a sus compaeros sexuales que sufre una infeccin vaginal. Deben consultar a su mdico y recibir tratamiento si tienen problemas, como picazn o una erupcin cutnea leve.  Durante el [place], es importante que siga estas indicaciones:  Fish farm manager relaciones sexuales o use preservativos de la forma correcta.  No se haga duchas vaginales.  Evite consumir alcohol como se lo haya indicado el mdico.  Psychologist, sport and exercise se lo haya indicado el mdico. SOLICITE ATENCIN MDICA SI:   Sus sntomas no mejoran despus de 3 das de Wal[employer].  Aumenta la secrecin o Chief Technology Officer.  Tiene fiebre. ASEGRESE DE QUE:   Comprende estas instrucciones.  Controlar su afeccin.  Recibir ayuda de inmediato si no mejora o si empeora. PARA OBTENER MS INFORMACIN  Centros para el control y la prevencin de Child psychotherapist for Disease Control and Prevention, CDC): SolutionApps.co.za Asociacin Estadounidense de la Salud Sexual (American Sexual Health Association, SHA): www.ashastd.org    Esta informacin no tiene Theme park manager el consejo del mdico. Asegrese de hacerle al mdico cualquier pregunta que tenga.   Document Released: 12/20/2007 Document Revised: 10/03/2014 Elsevier Interactive Patient Education 2016 Elsevier Inc. Clindamycin vaginal cream Qu es este medicamento? La CLINDAMICINA es un  antibitico lincosamida.  Se utiliza en el tratamiento de infecciones vaginales causadas por ciertas bacterias. Este medicamento puede ser utilizado para otros usos; si tiene alguna pregunta consulte con su proveedor de atencin mdica o con su farmacutico. Qu le debo informar a mi profesional de la salud antes de tomar este medicamento? Necesita saber si usted presenta alguno de los siguientes problemas o situaciones: -diarrea -enfermedad intestinal inflamatoria -enfermedad renal o heptica -problemas de estmago como colitis -una reaccin alrgica o inusual a la clindamicina, a la lincomicina o a otros medicamentos, alimentos, colorantes o conservantes -si est embarazada o buscando quedar embarazada -si est amamantando a un beb Cmo debo utilizar este medicamento? Este medicamento es para Chemical engineer solamente en la vagina. Aplquelo en la vagina utilizando el aplicador especial que se suministra con la crema. Lvese las manos antes y despus de usarlo. Llene el aplicador con la crema. Recustese de espaldas, separe y flexione las rodillas. Introduzca el aplicador en la vagina y empuje el mbolo para llevar la crema dentro de la vagina. Lave el aplicador con agua caliente y [place], y enjuguelo bien. Mantenga este medicamento alejado de los ojos. Si entra en contacto con sus ojos, enjuguelos con abundante agua fra del grifo. Utilice este medicamento a intervalos regulares. No utilice su medicamento con una frecuencia mayor a la indicada. Complete todo el tratamiento con el medicamento segn lo haya recetado su mdico o su profesional de la salud, aun si considera que su problema ha mejorado. No deje de usarlo excepto si as lo indica su mdico o su profesional de [employer]. Hable con su pediatra para informarse acerca del uso de este medicamento en nios. Puede requerir atencin especial. Sobredosis: Pngase en contacto inmediatamente con un centro toxicolgico o una sala de urgencia si usted cree  que haya tomado demasiado medicamento. ATENCIN: [employer] es solo para usted. No comparta este medicamento con nadie. Qu sucede si me olvido de una dosis? Si olvida una dosis, aplquela lo antes posible. Si es casi la hora de la prxima dosis, aplique slo esa dosis. No use dosis adicionales o dobles. Qu puede interactuar con este medicamento? No se esperan interacciones. No utilice otros productos vaginales sin consultar a su mdico o a su profesional de [employer]. Puede ser que esta lista no menciona todas las posibles interacciones. Informe a su profesional de [employer] de [employer] productos a base de hierbas, medicamentos de [place] o suplementos nutritivos que est tomando. Si usted fuma, consume bebidas alcohlicas o si utiliza drogas ilegales, indqueselo tambin a su profesional de [employer]. Algunas sustancias pueden interactuar con su medicamento. A qu debo estar atento al usar [employer]? Si los sntomas no comienzan a mejorar a los [address], informe a su mdico o a su profesional de [employer]. No utilice tampones ni irrigadores vaginales mientras est [employer]. No tenga relaciones sexuales hasta que haya completado el tratamiento. Las [employer] [place] pueden reducir la efectividad del Bird[place]in[place]. Despus de que complete el tratamiento, no utilice condones de ltex [employer]. Todava puede quedar medicamento en la vagina, lo que puede daar el ltex y reducir la efectividad del condn en la prevencin del [place]. Se le puede ensuciar la ropa. Para ayudar a prevenir la reinfeccin, use calzones recin lavados de algodn; no use los sintticos. Qu efectos secundarios puedo tener al [employer] este medicamento? Efectos secundarios que debe informar a su mdico o a Producer, television/film/video de la salud tan pronto como sea posible: -Librarian, academic erupcin  cutnea, picazn o urticarias, hinchazn de la cara, labios o lengua -diarrea acuosa  o severa -fiebre, escalofros, dolor de garganta -aumento de la sed -picazn en la regin vaginal o genital -dolor durante las relaciones sexuales -calambres o dolores estomacales -flujo vaginal blanco y espeso -sangrado o magulladuras inusuales Efectos secundarios que, por lo general, no requieren Psychologist, prison and probation servicesatencin mdica (debe informarlos a su mdico o a su profesional de la salud si persisten o si son molestos): -nuseas, vmitos Puede ser que Massachusetts Mutual Lifeesta lista no menciona todos los posibles efectos secundarios. Comunquese a su mdico por asesoramiento mdico Hewlett-Packardsobre los efectos secundarios. Usted puede informar los efectos secundarios a la FDA por telfono al 1-800-FDA-1088. Dnde debo guardar mi medicina? Mantngala fuera del alcance de los nios. Gurdela a Sanmina-SCItemperatura ambiente, entre 20 y 25 grados C (7268 y 8477 grados F). No la congele. Deseche todo el medicamento que no haya utilizado, despus de la fecha de vencimiento. ATENCIN: Este folleto es un resumen. Puede ser que no cubra toda la posible informacin. Si usted tiene preguntas acerca de esta medicina, consulte con su mdico, su farmacutico o su profesional de Radiographer, therapeuticla salud.    2016, Elsevier/Gold Standard. (2014-11-04 00:00:00)

## 2016-07-15 LAB — GC/CHLAMYDIA PROBE AMP
CT Probe RNA: NOT DETECTED
GC Probe RNA: NOT DETECTED

## 2017-02-08 ENCOUNTER — Encounter: Payer: Self-pay | Admitting: Gynecology

## 2017-02-13 ENCOUNTER — Encounter: Payer: Self-pay | Admitting: Gynecology

## 2017-02-13 ENCOUNTER — Ambulatory Visit (INDEPENDENT_AMBULATORY_CARE_PROVIDER_SITE_OTHER): Payer: BLUE CROSS/BLUE SHIELD | Admitting: Gynecology

## 2017-02-13 VITALS — BP 118/76 | Ht 60.0 in | Wt 125.0 lb

## 2017-02-13 DIAGNOSIS — M6289 Other specified disorders of muscle: Secondary | ICD-10-CM

## 2017-02-13 DIAGNOSIS — R635 Abnormal weight gain: Secondary | ICD-10-CM | POA: Diagnosis not present

## 2017-02-13 DIAGNOSIS — Z01419 Encounter for gynecological examination (general) (routine) without abnormal findings: Secondary | ICD-10-CM

## 2017-02-13 NOTE — Progress Notes (Signed)
Jenna Montoya 1971-06-22 [id number]   History:    46 y.o.  for annual gyn exam with a complaint of tiredness and fatigue. 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 Patient with no past history of any abnormal Pap smears prior to her hysterectomy. Patient [name] 7 pounds is last year.  Past medical history,surgical history, family history and social history were all reviewed and documented in the EPIC chart.  Gynecologic History Patient's last menstrual period was 02/09/2005. Contraception: status post hysterectomy Last Pap: 2017. Results were: normal Last mammogram: 2017. Results were: Normal but dense had three-dimensional mammogram  Obstetric History OB History  Gravida Para Term Preterm AB Living  4 4 4     4   SAB TAB Ectopic Multiple Live Births          4    # Outcome Date GA Lbr Len/2nd Weight Sex Delivery Anes PTL Lv  4 Term     F CS-Unspec  N LIV  3 Term     M Vag-Spont  N LIV  2 Term     F Vag-Spont  N LIV  1 Term     M Vag-Spont  N LIV       ROS: A ROS was performed and pertinent positives and negatives are included in the history.  GENERAL: No fevers or chills. HEENT: No change in vision, no earache, sore throat or sinus congestion. NECK: No pain or stiffness. CARDIOVASCULAR: No chest pain or pressure. No palpitations. PULMONARY: No shortness of breath, cough or wheeze. GASTROINTESTINAL: No abdominal pain, nausea, vomiting or diarrhea, melena or bright red blood per rectum. GENITOURINARY: No urinary frequency, urgency, hesitancy or dysuria. MUSCULOSKELETAL: No joint or muscle pain, no back pain, no recent trauma. DERMATOLOGIC: No rash, no itching, no lesions. ENDOCRINE: No  polyuria, polydipsia, no heat or cold intolerance. No recent change in weight. HEMATOLOGICAL: No anemia or easy bruising or bleeding. NEUROLOGIC: No headache, seizures, numbness, tingling or weakness. PSYCHIATRIC: No depression, no loss of interest in normal activity or change in sleep pattern.     Exam: chaperone present  BP 118/76   Ht 5' (1.524 m)   Wt 125 lb (56.7 kg)   LMP 02/09/2005   BMI 24.41 kg/m   Body mass index is 24.41 kg/m.  General appearance : Well developed well nourished female. No acute distress HEENT: Eyes: no retinal hemorrhage or exudates,  Neck supple, trachea midline, no carotid bruits, no thyroidmegaly Lungs: Clear to auscultation, no rhonchi or wheezes, or rib retractions  Heart: Regular rate and rhythm, no murmurs or gallops Breast:Examined in sitting and supine position were symmetrical in appearance, no palpable masses or tenderness,  no skin retraction, no nipple inversion, no nipple discharge, no skin discoloration, no axillary or supraclavicular lymphadenopathy Abdomen: no palpable masses or tenderness, no rebound or guarding Extremities: no edema or skin discoloration or tenderness  Pelvic:  Bartholin, Urethra, [name] Glands: Within normal limits             Vagina: No gross lesions or discharge  Cervix: Absent  Uterus  absent  Adnexa  Without masses or tenderness  Anus and perineum  normal   Rectovaginal  normal sphincter tone without palpated masses or tenderness  Hemoccult not indicated     Assessment/Plan:  46 y.o. female for annual exam with tiredness and fatigue and for this reason we'll check a TSH and a vitamin D level along with her screening blood work consisting of CBC, comprehensive metabolic panel and fasting lipid profile. We discussed importance of calcium vitamin D and weightbearing exercises for osteoporosis prevention. If patient blood work is normal on encouraged to continue to follow-up with her medical oncologist. If  her platelet counts are abnormally low we'll move patient's appointment with medical oncologist Len earlier time before next month. Patient was reminded to schedule her mammogram September this year and to request a three-dimensional mammogram because of her breast being dense.   Jenna EdwardsFERNANDEZ,Manraj Yeo H MD, 4:42 PM 02/13/2017

## 2017-02-14 ENCOUNTER — Other Ambulatory Visit: Payer: Self-pay

## 2017-02-15 ENCOUNTER — Encounter: Payer: Self-pay | Admitting: Gynecology

## 2017-02-15 ENCOUNTER — Other Ambulatory Visit: Payer: BLUE CROSS/BLUE SHIELD

## 2017-02-15 ENCOUNTER — Ambulatory Visit (INDEPENDENT_AMBULATORY_CARE_PROVIDER_SITE_OTHER): Payer: BLUE CROSS/BLUE SHIELD | Admitting: Gynecology

## 2017-02-15 VITALS — BP 118/76

## 2017-02-15 DIAGNOSIS — R5383 Other fatigue: Secondary | ICD-10-CM | POA: Diagnosis not present

## 2017-02-15 DIAGNOSIS — R42 Dizziness and giddiness: Secondary | ICD-10-CM | POA: Diagnosis not present

## 2017-02-15 DIAGNOSIS — R531 Weakness: Secondary | ICD-10-CM | POA: Diagnosis not present

## 2017-02-15 LAB — LIPID PANEL
Cholesterol: 182 mg/dL (ref <200)
HDL: 50 mg/dL — ABNORMAL LOW (ref >50)
LDL CALC: 113 mg/dL — AB (ref <100)
TRIGLYCERIDES: 96 mg/dL (ref <150)
Total CHOL/HDL Ratio: 3.6 Ratio (ref <5.0)
VLDL: 19 mg/dL (ref <30)

## 2017-02-15 LAB — COMPREHENSIVE METABOLIC PANEL
ALBUMIN: 4.3 g/dL (ref 3.6–5.1)
ALK PHOS: 80 U/L (ref 33–115)
ALT: 21 U/L (ref 6–29)
AST: 25 U/L (ref 10–35)
BILIRUBIN TOTAL: 0.9 mg/dL (ref 0.2–1.2)
BUN: 13 mg/dL (ref 7–25)
CALCIUM: 9.6 mg/dL (ref 8.6–10.2)
CO2: 22 mmol/L (ref 20–31)
CREATININE: 0.7 mg/dL (ref 0.50–1.10)
Chloride: 106 mmol/L (ref 98–110)
Glucose, Bld: 88 mg/dL (ref 65–99)
Potassium: 3.6 mmol/L (ref 3.5–5.3)
SODIUM: 141 mmol/L (ref 135–146)
Total Protein: 7.4 g/dL (ref 6.1–8.1)

## 2017-02-15 LAB — CBC WITH DIFFERENTIAL/PLATELET
BASOS PCT: 0 %
Basophils Absolute: 0 cells/uL (ref 0–200)
EOS PCT: 0 %
Eosinophils Absolute: 0 cells/uL — ABNORMAL LOW (ref 15–500)
HEMATOCRIT: 43 % (ref 35.0–45.0)
HEMOGLOBIN: 14.2 g/dL (ref 11.7–15.5)
LYMPHS ABS: 2356 {cells}/uL (ref 850–3900)
Lymphocytes Relative: 38 %
MCH: 30.1 pg (ref 27.0–33.0)
MCHC: 33 g/dL (ref 32.0–36.0)
MCV: 91.1 fL (ref 80.0–100.0)
MONO ABS: 372 {cells}/uL (ref 200–950)
MPV: 11.5 fL (ref 7.5–12.5)
Monocytes Relative: 6 %
NEUTROS ABS: 3472 {cells}/uL (ref 1500–7800)
Neutrophils Relative %: 56 %
Platelets: 171 10*3/uL (ref 140–400)
RBC: 4.72 MIL/uL (ref 3.80–5.10)
RDW: 14 % (ref 11.0–15.0)
WBC: 6.2 10*3/uL (ref 3.8–10.8)

## 2017-02-15 NOTE — Progress Notes (Signed)
   Patient is a 46 year old who was seen in the office 2 days ago for her annual exam was asymptomatic and was here on a fasting state to get her blood drawn and mentioned to one of our CMA that she had been feeling tired and fatigued and this morning felt somewhat lightheaded. She has been fasting all morning for her blood work today. Review of her record indicated that she has a history of ITP and has been followed by her medical oncologist which she scheduled to see in the next month.  Exam: Gen. appearance well-developed C Mono acute distress HEENT unremarkable Neck: Supple trachea midline no carotid bruits Lungs: Clear to auscultation no rhonchi's or wheezes Heart: Regular rate and rhythm no murmurs or gallops Extremities: No edema  Assessment/plan: Patient with isolated episodes of lightheadedness today doing well now. She had been fasting for blood work today. I've encouraged her to eat something today. The following blood work was ordered today we'll have the results later CBC, comprehensive metabolic panel, along with fasting lipid profile we will had a TSH today to have blood work. Will notify her since the results become available. She is going to follow-up with her PCP this week. We'll for them a copy of these lab results.  Greater than 90% time was spent calcium coordinate care for this patient. Time of consultation 10 minutes

## 2017-02-16 LAB — TSH
TSH: 1.69 m[IU]/L
TSH: 1.72 m[IU]/L

## 2017-02-16 LAB — VITAMIN D 25 HYDROXY (VIT D DEFICIENCY, FRACTURES): VIT D 25 HYDROXY: 51 ng/mL (ref 30–100)

## 2017-03-16 ENCOUNTER — Telehealth: Payer: Self-pay | Admitting: Oncology

## 2017-03-16 NOTE — Telephone Encounter (Signed)
Appointments rescheduled per patient request. Patient was given a copy of the updated appointment schedule.

## 2017-04-06 ENCOUNTER — Other Ambulatory Visit: Payer: BLUE CROSS/BLUE SHIELD

## 2017-04-06 ENCOUNTER — Ambulatory Visit: Payer: BLUE CROSS/BLUE SHIELD | Admitting: Nurse Practitioner

## 2017-04-19 ENCOUNTER — Other Ambulatory Visit: Payer: Self-pay | Admitting: *Deleted

## 2017-04-19 DIAGNOSIS — D693 Immune thrombocytopenic purpura: Secondary | ICD-10-CM

## 2017-04-20 ENCOUNTER — Other Ambulatory Visit: Payer: BLUE CROSS/BLUE SHIELD

## 2017-04-20 ENCOUNTER — Ambulatory Visit: Payer: BLUE CROSS/BLUE SHIELD | Admitting: Nurse Practitioner

## 2017-04-20 VITALS — BP 123/79 | HR 62 | Temp 98.6°F | Resp 18 | Ht 60.0 in | Wt 127.7 lb

## 2017-04-20 DIAGNOSIS — D693 Immune thrombocytopenic purpura: Secondary | ICD-10-CM

## 2017-04-20 LAB — CBC WITH DIFFERENTIAL/PLATELET
BASO%: 0.7 % (ref 0.0–2.0)
BASOS ABS: 0 10*3/uL (ref 0.0–0.1)
EOS ABS: 0 10*3/uL (ref 0.0–0.5)
EOS%: 0.4 % (ref 0.0–7.0)
HEMATOCRIT: 42.4 % (ref 34.8–46.6)
HEMOGLOBIN: 14.1 g/dL (ref 11.6–15.9)
LYMPH%: 42.7 % (ref 14.0–49.7)
MCH: 30.3 pg (ref 25.1–34.0)
MCHC: 33.2 g/dL (ref 31.5–36.0)
MCV: 91.2 fL (ref 79.5–101.0)
MONO#: 0.6 10*3/uL (ref 0.1–0.9)
MONO%: 9.8 % (ref 0.0–14.0)
NEUT#: 2.9 10*3/uL (ref 1.5–6.5)
NEUT%: 46.4 % (ref 38.4–76.8)
Platelets: 157 10*3/uL (ref 145–400)
RBC: 4.64 10*6/uL (ref 3.70–5.45)
RDW: 13.1 % (ref 11.2–14.5)
WBC: 6.3 10*3/uL (ref 3.9–10.3)
lymph#: 2.7 10*3/uL (ref 0.9–3.3)

## 2017-04-20 NOTE — Progress Notes (Addendum)
  [facility] OFFICE PROGRESS NOTE   Diagnosis:  ITP  INTERVAL HISTORY:   Ms. Jenna Montoya returns as scheduled. She feels well. No bruising or bleeding. No interim illnesses or infections.  Objective:  Vital signs in last 24 hours:  Blood pressure 123/79, pulse 62, temperature 98.6 F (37 C), resp. rate 18, height 5' (1.524 m), weight 127 lb 11.2 oz (57.9 kg), last menstrual period 02/09/2005, SpO2 100 %.    HEENT: Neck without mass. Lymphatics: No palpable cervical, supraclavicular or axillary lymph nodes. Resp: Lungs clear bilaterally. Cardio: Regular rate and rhythm. GI: No hepatosplenomegaly. Vascular: No leg edema. Skin: No petechiae or ecchymoses.    Lab Results:  Lab Results  Component Value Date   WBC 6.3 04/20/2017   HGB 14.1 04/20/2017   HCT 42.4 04/20/2017   MCV 91.2 04/20/2017   PLT 157 04/20/2017   NEUTROABS 2.9 04/20/2017    Imaging:  No results found.  Medications: I have reviewed the patient's current medications.  Assessment/Plan: 1. Chronic ITP. 2. History of iron deficiency anemia. Resolved.   Disposition: Ms. Jenna Montoya remains stable from a hematologic standpoint. We did not schedule formal follow-up in our office. She will follow-up with her PCP. Our recommendation is for her to have an annual CBC. We will be happy to see her in the future if needed. She understands she can contact our office for spontaneous bleeding or bruising.  Patient seen with Dr. Truett PernaSherrill.  Lonna Cobbhomas, Jenna Montoya ANP/GNP-BC   04/20/2017  3:29 PM  This was a shared visit with Lonna CobbLisa Fatih Montoya. Ms. Jenna Montoya remains in remission from ITP. She will be discharged from the hematology clinic. She will continue clinical follow-up with her primary physician. We are available to see her in the future as needed.  Mancel BaleBrad Sherrill, M.D.

## 2017-04-21 ENCOUNTER — Telehealth: Payer: Self-pay | Admitting: *Deleted

## 2017-04-21 NOTE — Telephone Encounter (Signed)
Telephone call to patient to find out who PCP is. Left message for return call.

## 2017-04-21 NOTE — Telephone Encounter (Signed)
-----   Message from Rana SnareLisa K Thomas, NP sent at 04/21/2017  9:20 AM EDT ----- She told me yesterday she has a PCP. No PCP is listed. We discharged her to follow-up with her PCP. Please try to find out who this is and fax a copy of our office note from yesterday. Thanks

## 2017-05-04 NOTE — Telephone Encounter (Signed)
Attempted to reach patient again- no answer voicemail is full.

## 2018-04-02 ENCOUNTER — Ambulatory Visit: Payer: BLUE CROSS/BLUE SHIELD | Admitting: Women's Health

## 2018-04-02 ENCOUNTER — Encounter: Payer: Self-pay | Admitting: Women's Health

## 2018-04-02 VITALS — BP 130/80 | Ht 61.0 in | Wt 129.0 lb

## 2018-04-02 DIAGNOSIS — Z13220 Encounter for screening for lipoid disorders: Secondary | ICD-10-CM | POA: Diagnosis not present

## 2018-04-02 DIAGNOSIS — Z01419 Encounter for gynecological examination (general) (routine) without abnormal findings: Secondary | ICD-10-CM

## 2018-04-02 LAB — CBC WITH DIFFERENTIAL/PLATELET
BASOS PCT: 0.5 %
Basophils Absolute: 30 cells/uL (ref 0–200)
EOS PCT: 0.7 %
Eosinophils Absolute: 41 cells/uL (ref 15–500)
HEMATOCRIT: 41.6 % (ref 35.0–45.0)
Hemoglobin: 14.5 g/dL (ref 11.7–15.5)
LYMPHS ABS: 2360 {cells}/uL (ref 850–3900)
MCH: 31.2 pg (ref 27.0–33.0)
MCHC: 34.9 g/dL (ref 32.0–36.0)
MCV: 89.5 fL (ref 80.0–100.0)
MPV: 12 fL (ref 7.5–12.5)
Monocytes Relative: 8.5 %
NEUTROS ABS: 2968 {cells}/uL (ref 1500–7800)
Neutrophils Relative %: 50.3 %
Platelets: 158 10*3/uL (ref 140–400)
RBC: 4.65 10*6/uL (ref 3.80–5.10)
RDW: 13.2 % (ref 11.0–15.0)
Total Lymphocyte: 40 %
WBC: 5.9 10*3/uL (ref 3.8–10.8)
WBCMIX: 502 {cells}/uL (ref 200–950)

## 2018-04-02 LAB — LIPID PANEL
Cholesterol: 186 mg/dL (ref <200)
HDL: 45 mg/dL — AB (ref >50)
LDL CHOLESTEROL (CALC): 100 mg/dL — AB
NON-HDL CHOLESTEROL (CALC): 141 mg/dL — AB (ref <130)
TRIGLYCERIDES: 285 mg/dL — AB (ref <150)
Total CHOL/HDL Ratio: 4.1 (calc) (ref <5.0)

## 2018-04-02 LAB — GLUCOSE, RANDOM: GLUCOSE: 90 mg/dL (ref 65–99)

## 2018-04-02 NOTE — Progress Notes (Signed)
Charlton HawsFilomena Vespa 04/01/1971 [id number]    History:    Presents for annual exam.  2006 TAH for fibroids on no HRT.  History of ITP.  Normal Pap and mammogram history.  Overdue for mammogram.  GERD  Past medical history, past surgical history, family history and social history were all reviewed and documented in the EPIC chart.  Works active job, 4 children ages 9918 through [age],  younger 2  have had Gardasil.  ROS:  A ROS was performed and pertinent positives and negatives are included.  Exam:  Vitals:   04/02/18 1202  BP: 130/80  Weight: 129 lb (58.5 kg)  Height: 5\' 1"  (1.549 m)   Body mass index is 24.37 kg/m.   General appearance:  Normal Thyroid:  Symmetrical, normal in size, without palpable masses or nodularity. Respiratory  Auscultation:  Clear without wheezing or rhonchi Cardiovascular  Auscultation:  Regular rate, without rubs, murmurs or gallops  Edema/varicosities:  Not grossly evident Abdominal  Soft,nontender, without masses, guarding or rebound.  Liver/spleen:  No organomegaly noted  Hernia:  None appreciated  Skin  Inspection:  Grossly normal   Breasts: Examined lying and sitting.     Right: Without masses, retractions, discharge or axillary adenopathy.     Left: Without masses, retractions, discharge or axillary adenopathy. Gentitourinary   Inguinal/mons:  Normal without inguinal adenopathy  External genitalia:  Normal  BUS/Urethra/[name]'s glands:  Normal  Vagina:  Normal  Cervix:  Normal  Uterus:  normal in size, shape and contour.  Midline and mobile  Adnexa/parametria:     Rt: Without masses or tenderness.   Lt: Without masses or tenderness.  Anus and perineum: Normal  Digital rectal exam: Normal sphincter tone without palpated masses or tenderness  Assessment/Plan:  47 y.o. M HF G5, P4 for annual exam with no complaints.  2006 TAH for fibroids GERD  Plan: SBE's, reviewed importance of annual screening mammogram, instructed to schedule.   Exercise, calcium rich foods, vitamin D 1000 daily encouraged.  CBC, lipid panel, glucose. Pap  Screening guidelines reviewed.   Harrington Challengerancy J Randale Carvalho [facility] HospitalWHNP, 1:14 PM 04/02/2018

## 2018-04-02 NOTE — Patient Instructions (Signed)

## 2019-02-25 ENCOUNTER — Encounter: Payer: Self-pay | Admitting: Women's Health

## 2019-02-25 ENCOUNTER — Other Ambulatory Visit: Payer: Self-pay

## 2019-02-25 ENCOUNTER — Ambulatory Visit: Payer: BLUE CROSS/BLUE SHIELD | Admitting: Women's Health

## 2019-02-25 VITALS — BP 134/80

## 2019-02-25 DIAGNOSIS — N63 Unspecified lump in unspecified breast: Secondary | ICD-10-CM

## 2019-02-25 NOTE — Progress Notes (Signed)
48 year old MWF G5 P4 presents with questionable nonpainful right upper breast lump, noticed in the shower.  Normal mammogram history at [facility], last mammogram 06/2018 per patient.  No breast cancer history.  Denies injury, pain, change in routine or exercise.  2006 TAH for fibroids.  Denies menopausal symptoms, urinary symptoms, vaginal discharge, abdominal/back pain or fever.  History of ITP.  Exam: Appears well.  Breast examined sitting and lying position without visible retractions, dimpling, erythema or nipple discharge.  No palpable nodules, area of concern is a rib, right breast rib more palpable than left breast ribs but both at same level.  Patient palpated as well.  Normal breast exam  Plan: reviewed normality of exam, common to feel ribs especially when slim.  Reassurance given, continue annual 3D screening mammogram history of dense breasts.  Questions answered.  Annual exam due in July.

## 2019-04-05 ENCOUNTER — Other Ambulatory Visit: Payer: Self-pay

## 2019-04-08 ENCOUNTER — Encounter: Payer: Self-pay | Admitting: Women's Health

## 2019-04-08 ENCOUNTER — Ambulatory Visit (INDEPENDENT_AMBULATORY_CARE_PROVIDER_SITE_OTHER): Payer: BLUE CROSS/BLUE SHIELD | Admitting: Women's Health

## 2019-04-08 ENCOUNTER — Other Ambulatory Visit: Payer: Self-pay

## 2019-04-08 VITALS — BP 122/80 | Ht 61.0 in | Wt 127.0 lb

## 2019-04-08 DIAGNOSIS — Z01419 Encounter for gynecological examination (general) (routine) without abnormal findings: Secondary | ICD-10-CM

## 2019-04-08 DIAGNOSIS — Z13220 Encounter for screening for lipoid disorders: Secondary | ICD-10-CM | POA: Diagnosis not present

## 2019-04-08 MED ORDER — ESTRADIOL 0.05 MG/24HR TD PTTW: 1.0000 | MEDICATED_PATCH | TRANSDERMAL | 4 refills | Status: DC

## 2019-04-08 NOTE — Progress Notes (Signed)
Jenna Montoya Jan 24, 1971 [id number]    History:    Presents for annual exam.  2006 TAH for fibroids.  Normal Pap and mammogram history.  Has had mammograms at [facility] reports as normal not in chart.  History of chronic ITP has seen a hematologist.  Reports an increased number of hot flushes/poor sleep.  Does work night shift.  Interpreter present but does speak some [place].  Past medical history, past surgical history, family history and social history were all reviewed and documented in the EPIC chart.  Works in a factory.  4 children youngest 26, all healthy doing well.  ROS:  A ROS was performed and pertinent positives and negatives are included.  Exam:  Vitals:   04/08/19 0916  BP: 122/80  Weight: 127 lb (57.6 kg)  Height: 5\' 1"  (1.549 m)   Body mass index is 24 kg/m.   General appearance:  Normal Thyroid:  Symmetrical, normal in size, without palpable masses or nodularity. Respiratory  Auscultation:  Clear without wheezing or rhonchi Cardiovascular  Auscultation:  Regular rate, without rubs, murmurs or gallops  Edema/varicosities:  Not grossly evident Abdominal  Soft,nontender, without masses, guarding or rebound.  Liver/spleen:  No organomegaly noted  Hernia:  None appreciated  Skin  Inspection:  Grossly normal   Breasts: Examined lying and sitting.     Right: Without masses, retractions, discharge or axillary adenopathy.     Left: Without masses, retractions, discharge or axillary adenopathy. Gentitourinary   Inguinal/mons:  Normal without inguinal adenopathy  External genitalia:  Normal  BUS/Urethra/[name]'s glands:  Normal  Vagina:  Normal  Cervix: And uterus absent  Adnexa/parametria:     Rt: Without masses or tenderness.   Lt: Without masses or tenderness.  Anus and perineum: Normal  Digital rectal exam: Normal sphincter tone without palpated masses or tenderness  Assessment/Plan:  48 y.o. MWF G5, P4 for annual exam with complaint of increased hot  flushes.  2006 TAH for fibroids with menopausal symptoms Chronic ITP hematologist manages  Plan: HRT options reviewed, will try Vivelle dot 0.05 twice weekly, proper instructions reviewed, will call if no relief of symptoms.  SBEs, continue annual 3D screening mammogram history of dense breasts, instructed to have [facility] send report to our office.  Continue healthy lifestyle of regular exercise, maintaining weight, vitamin D 1000 daily encouraged.  CBC, CMP, lipid panel.,   Huel Cote [facility], 9:39 AM 04/08/2019

## 2019-04-08 NOTE — Patient Instructions (Addendum)
Health Maintenance for Postmenopausal Women Menopause is a normal process in which your ability to get pregnant comes to an end. This process happens slowly over many months or years, usually between the ages of 48 and 55. Menopause is complete when you have missed your menstrual periods for 12 months. It is important to talk with your health care provider about some of the most common conditions that affect women after menopause (postmenopausal women). These include heart disease, cancer, and bone loss (osteoporosis). Adopting a healthy lifestyle and getting preventive care can help to promote your health and wellness. The actions you take can also lower your chances of developing some of these common conditions. What should I know about menopause? During menopause, you may get a number of symptoms, such as:  Hot flashes. These can be moderate or severe.  Night sweats.  Decrease in sex drive.  Mood swings.  Headaches.  Tiredness.  Irritability.  Memory problems.  Insomnia. Choosing to treat or not to treat these symptoms is a decision that you make with your health care provider. Do I need hormone replacement therapy?  Hormone replacement therapy is effective in treating symptoms that are caused by menopause, such as hot flashes and night sweats.  Hormone replacement carries certain risks, especially as you become older. If you are thinking about using estrogen or estrogen with progestin, discuss the benefits and risks with your health care provider. What is my risk for heart disease and stroke? The risk of heart disease, heart attack, and stroke increases as you age. One of the causes may be a change in the body's hormones during menopause. This can affect how your body uses dietary fats, triglycerides, and cholesterol. Heart attack and stroke are medical emergencies. There are many things that you can do to help prevent heart disease and stroke. Watch your blood pressure  High  blood pressure causes heart disease and increases the risk of stroke. This is more likely to develop in people who have high blood pressure readings, are of African descent, or are overweight.  Have your blood pressure checked: ? Every 3-5 years if you are 18-39 years of age. ? Every year if you are 40 years old or older. Eat a healthy diet   Eat a diet that includes plenty of vegetables, fruits, low-fat dairy products, and lean protein.  Do not eat a lot of foods that are high in solid fats, added sugars, or sodium. Get regular exercise Get regular exercise. This is one of the most important things you can do for your health. Most adults should:  Try to exercise for at least 150 minutes each week. The exercise should increase your heart rate and make you sweat (moderate-intensity exercise).  Try to do strengthening exercises at least twice each week. Do these in addition to the moderate-intensity exercise.  Spend less time sitting. Even light physical activity can be beneficial. Other tips  Work with your health care provider to achieve or maintain a healthy weight.  Do not use any products that contain nicotine or tobacco, such as cigarettes, e-cigarettes, and chewing tobacco. If you need help quitting, ask your health care provider.  Know your numbers. Ask your health care provider to check your cholesterol and your blood sugar (glucose). Continue to have your blood tested as directed by your health care provider. Do I need screening for cancer? Depending on your health history and family history, you may need to have cancer screening at different stages of your life. This   may include screening for:  Breast cancer.  Cervical cancer.  Lung cancer.  Colorectal cancer. What is my risk for osteoporosis? After menopause, you may be at increased risk for osteoporosis. Osteoporosis is a condition in which bone destruction happens more quickly than new bone creation. To help prevent  osteoporosis or the bone fractures that can happen because of osteoporosis, you may take the following actions:  If you are 75-49 years old, get at least 1,000 mg of calcium and at least 600 mg of vitamin D per day.  If you are older than age 27 but younger than age 13, get at least 1,200 mg of calcium and at least 600 mg of vitamin D per day.  If you are older than age 60, get at least 1,200 mg of calcium and at least 800 mg of vitamin D per day. Smoking and drinking excessive alcohol increase the risk of osteoporosis. Eat foods that are rich in calcium and vitamin D, and do weight-bearing exercises several times each week as directed by your health care provider. How does menopause affect my mental health? Depression may occur at any age, but it is more common as you become older. Common symptoms of depression include:  Low or sad mood.  Changes in sleep patterns.  Changes in appetite or eating patterns.  Feeling an overall lack of motivation or enjoyment of activities that you previously enjoyed.  Frequent crying spells. Talk with your health care provider if you think that you are experiencing depression. General instructions See your health care provider for regular wellness exams and vaccines. This may include:  Scheduling regular health, dental, and eye exams.  Getting and maintaining your vaccines. These include: ? Influenza vaccine. Get this vaccine each year before the flu season begins. ? Pneumonia vaccine. ? Shingles vaccine. ? Tetanus, diphtheria, and pertussis (Tdap) booster vaccine. Your health care provider may also recommend other immunizations. Tell your health care provider if you have ever been abused or do not feel safe at home. Summary  Menopause is a normal process in which your ability to get pregnant comes to an end.  This condition causes hot flashes, night sweats, decreased interest in sex, mood swings, headaches, or lack of sleep.  Treatment for this  condition may include hormone replacement therapy.  Take actions to keep yourself healthy, including exercising regularly, eating a healthy diet, watching your weight, and checking your blood pressure and blood sugar levels.  Get screened for cancer and depression. Make sure that you are up to date with all your vaccines. This information is not intended to replace advice given to you by your health care provider. Make sure you discuss any questions you have with your health care provider. Document Released: 11/04/2005 Document Revised: 09/05/2018 Document Reviewed: 09/05/2018 Elsevier Patient Education  2020 [place] Maintenance for Postmenopausal Women La menopausia es un proceso normal en el cual la capacidad de quedar embarazada llega a su fin. Este proceso ocurre lentamente a lo largo de un perodo de muchos meses o aos; por lo general, entre los 5 y los 36aos. La menopausia es completa cuando no se ha tenido el perodo menstrual por 69meses. Es importante hablar con el mdico sobre algunas de las enfermedades ms comunes que afectan a las mujeres despus de la menopausia (mujeres posmenopusicas). Estas incluyen la enfermedad cardaca, el cncer y la prdida sea (osteoporosis). Adoptar un estilo de vida saludable y recibir atencin preventiva pueden  ayudar a Teacher, English as a foreign language y Musician. Las medidas que tome tambin pueden reducir las probabilidades de Actor algunas de estas enfermedades frecuentes. Qu debo saber acerca de la menopausia? Durante la menopausia, puede tener una serie de sntomas, por ejemplo:  Acaloramiento. Estos pueden ser moderados o intensos.  Sudoracin nocturna.  Disminucin del deseo sexual.  Cambios en el estado de nimo.  Dolores de [place].  Cansancio.  Irritabilidad.  Problemas de memoria.  Insomnio. Tratar o no estos sntomas es una decisin que se toma con el  mdico. Necesito terapia de reemplazo hormonal?  La terapia de reemplazo hormonal es eficaz para tratar los sntomas causados por la menopausia, como los acaloramientos y las sudoraciones nocturnas.  La reposicin hormonal conlleva ciertos riesgos, especialmente a medida que una mujer envejece. Si est pensando en usar estrgeno o estrgeno con progestina, analice los beneficios y los riesgos con el mdico. Cul es mi riesgo de sufrir enfermedad cardaca y accidente cerebrovascular? A medida que se envejece, aumenta el riesgo de enfermedad cardaca, infarto de miocardio y accidente cerebrovascular. Una de las causas puede ser un cambio en las hormonas del cuerpo durante la menopausia. Esto puede afectar la forma en que el organismo procesa las [place], los triglicridos y el colesterol de su dieta. El infarto de miocardio y el accidente cerebrovascular son emergencias mdicas. Hay muchas cosas que se pueden hacer para ayudar a prevenir la enfermedad cardaca y el accidente cerebrovascular. Contrlese la presin arterial  La hipertensin arterial causa enfermedades cardacas y [place] el riesgo de accidente cerebrovascular. Es ms probable que esto se manifieste en las personas que tienen lecturas de presin arterial alta, tienen ascendencia africana o tienen sobrepeso.  Hgase controlar la presin arterial: ? Cada 3 a 5 aos si tiene entre 18 y 70 aos. ? Todos los aos si es mayor de [place]. Consuma una dieta saludable   Consuma una dieta que incluya muchas verduras, frutas, productos lcteos con bajo contenido de [place] y Advertising account planner.  No consuma muchos alimentos ricos en grasas slidas, azcares agregados o sodio. Haga ejercicio con regularidad Haga ejercicio con regularidad. Esta es una de las prcticas ms importantes que puede hacer por su salud. La mayora de los adultos deben seguir estas pautas:  Intente realizar al menos 159minutos de actividad fsica por semana. El ejercicio  debe aumentar la frecuencia cardaca y Nature conservation officer transpirar (ejercicio de intensidad moderada).  Intente hacer ejercicios de elongacin por lo menos dos veces por semana. Agrguelos al plan de ejercicio de intensidad moderada.  Pasar menos tiempo sentados. Incluso la actividad fsica ligera puede ser beneficiosa. Otros consejos  Trabaje con su mdico para Science writer o Theatre manager un peso saludable.  No consuma ningn producto que contenga nicotina o tabaco, como cigarrillos, cigarrillos electrnicos y tabaco de Higher education careers adviser. Si necesita ayuda para dejar de fumar, consulte al mdico.  Conozca sus cifras. Pdale al mdico que le controle el colesterol y el nivel sanguneo de azcar en la sangre (glucosa). Siga hacindose anlisis de [employer] se lo haya indicado el mdico. Necesito realizarme pruebas de deteccin del cncer? Segn su historia clnica y sus antecedentes familiares, es posible que deba realizarse pruebas de deteccin del cncer en diferentes etapas de la vida. Esto puede incluir pruebas de deteccin de lo siguiente:  Cncer de mama.  Cncer de cuello uterino.  Cncer de pulmn.  Cncer colorrectal. Cul es mi riesgo de tener osteoporosis? Despus de la menopausia, puede correr un riesgo ms alto de tener osteoporosis. La osteoporosis es  una afeccin en la cual la destruccin de la masa sea ocurre con mayor rapidez que su formacin. Para ayudar a prevenir esta afeccin o las fracturas seas que pueden ocurrir a causa de [place], usted puede tomar las siguientes medidas:  Si tiene entre 19 y 50aos, tome como mnimo 1000mg  de calcio y 600mg  de vitaminaD por Training and development officer.  Si es mayor de 50aos pero menor de 47aos, tome como mnimo 1200mg  de calcio y 600mg  de vitaminaD por Training and development officer.  Si es mayor de 70aos, tome como mnimo 1200mg  de calcio y 800mg  de vitaminaD por Training and development officer. Fumar y beber alcohol en exceso aumentan el riesgo de osteoporosis. Consuma alimentos ricos en calcio y vitaminaD, y  haga ejercicios con soporte de peso varias veces a la semana, como se lo haya indicado el mdico. De qu manera la menopausia afecta mi salud mental? La depresin puede presentarse a cualquier edad, pero es ms frecuente a medida que una persona envejece. Los sntomas comunes de depresin incluyen lo siguiente:  Desnimo o tristeza.  Cambios en los patrones de sueo.  Cambios en el apetito o en los hbitos de alimentacin.  Sensacin de falta general de motivacin o placer al [employer] actividades que sola disfrutar.  Crisis frecuentes de llanto. Hable con el mdico si cree que tiene depresin. Instrucciones generales Visite a su mdico para hacerse exmenes de bienestar peridicos y aplicarse vacunas. Puede incluir:  Programar exmenes peridicos dentales, de la salud y de Public librarian.  Recibir y [employer]. Estos incluyen los siguientes: ? Human resources officer. Aplquese esta vacuna todos los aos antes de que comience la temporada de gripe. ? Vacuna contra la neumona. ? Vacuna contra el herpes. ? Vacuna contra el ttanos, la difteria y la tos Dyann Ruddle (Tdap). El mdico tambin puede recomendarle que se aplique otras vacunas. Notifique a su mdico si alguna vez ha sido vctima de abuso o si no se siente seguro en su hogar. Resumen  La menopausia es un proceso normal en el cual la capacidad de quedar embarazada llega a su fin.  Esta condicin causa acaloramientos, sudoraciones nocturnas, disminucin del inters en el sexo, cambios en el estado de nimo, dolores de [place] o falta de sueo.  El tratamiento de esta afeccin puede incluir una terapia de reemplazo hormonal.  Tome medidas para mantenerse 26, entre ellas, hacer ejercicio con regularidad, seguir una dieta saludable, controlar su peso y medirse la presin arterial y los niveles de Dispensing optician.  Hgase pruebas para Film/video editor y depresin. Asegrese de estar al da con todas las vacunas. Esta  informacin no tiene Marine scientist el consejo del mdico. Asegrese de hacerle al mdico cualquier pregunta que tenga. Document Released: 07/03/2013 Document Revised: 10/03/2018 Document Reviewed: 10/03/2018 Elsevier Patient Education  2020 [employer].

## 2019-04-09 LAB — COMPREHENSIVE METABOLIC PANEL
AG Ratio: 1.5 (calc) (ref 1.0–2.5)
ALT: 25 U/L (ref 6–29)
AST: 21 U/L (ref 10–35)
Albumin: 4.2 g/dL (ref 3.6–5.1)
Alkaline phosphatase (APISO): 97 U/L (ref 31–125)
BUN: 14 mg/dL (ref 7–25)
CO2: 25 mmol/L (ref 20–32)
Calcium: 9.6 mg/dL (ref 8.6–10.2)
Chloride: 105 mmol/L (ref 98–110)
Creat: 0.64 mg/dL (ref 0.50–1.10)
Globulin: 2.8 g/dL (calc) (ref 1.9–3.7)
Glucose, Bld: 98 mg/dL (ref 65–99)
Potassium: 3.6 mmol/L (ref 3.5–5.3)
Sodium: 139 mmol/L (ref 135–146)
Total Bilirubin: 1 mg/dL (ref 0.2–1.2)
Total Protein: 7 g/dL (ref 6.1–8.1)

## 2019-04-09 LAB — CBC WITH DIFFERENTIAL/PLATELET
Absolute Monocytes: 470 cells/uL (ref 200–950)
Basophils Absolute: 29 cells/uL (ref 0–200)
Basophils Relative: 0.6 %
Eosinophils Absolute: 48 cells/uL (ref 15–500)
Eosinophils Relative: 1 %
HCT: 43.6 % (ref 35.0–45.0)
Hemoglobin: 14.4 g/dL (ref 11.7–15.5)
Lymphs Abs: 2299 cells/uL (ref 850–3900)
MCH: 30.3 pg (ref 27.0–33.0)
MCHC: 33 g/dL (ref 32.0–36.0)
MCV: 91.8 fL (ref 80.0–100.0)
MPV: 11.6 fL (ref 7.5–12.5)
Monocytes Relative: 9.8 %
Neutro Abs: 1954 cells/uL (ref 1500–7800)
Neutrophils Relative %: 40.7 %
Platelets: 159 10*3/uL (ref 140–400)
RBC: 4.75 10*6/uL (ref 3.80–5.10)
RDW: 13.1 % (ref 11.0–15.0)
Total Lymphocyte: 47.9 %
WBC: 4.8 10*3/uL (ref 3.8–10.8)

## 2019-04-09 LAB — LIPID PANEL
Cholesterol: 186 mg/dL (ref <200)
HDL: 40 mg/dL — ABNORMAL LOW (ref >=50)
LDL Cholesterol (Calc): 116 mg/dL (calc) — ABNORMAL HIGH
Non-HDL Cholesterol (Calc): 146 mg/dL (calc) — ABNORMAL HIGH (ref <130)
Total CHOL/HDL Ratio: 4.7 (calc) (ref <5.0)
Triglycerides: 178 mg/dL — ABNORMAL HIGH (ref <150)

## 2019-07-19 ENCOUNTER — Encounter: Payer: Self-pay | Admitting: Women's Health

## 2020-04-08 ENCOUNTER — Other Ambulatory Visit: Payer: Self-pay

## 2020-04-08 ENCOUNTER — Encounter: Payer: Self-pay | Admitting: Nurse Practitioner

## 2020-04-08 ENCOUNTER — Ambulatory Visit (INDEPENDENT_AMBULATORY_CARE_PROVIDER_SITE_OTHER): Payer: 59 | Admitting: Nurse Practitioner

## 2020-04-08 VITALS — BP 122/83 | Ht 61.0 in | Wt 124.2 lb

## 2020-04-08 DIAGNOSIS — Z13820 Encounter for screening for osteoporosis: Secondary | ICD-10-CM | POA: Diagnosis not present

## 2020-04-08 DIAGNOSIS — Z01419 Encounter for gynecological examination (general) (routine) without abnormal findings: Secondary | ICD-10-CM

## 2020-04-08 DIAGNOSIS — Z13220 Encounter for screening for lipoid disorders: Secondary | ICD-10-CM | POA: Diagnosis not present

## 2020-04-08 DIAGNOSIS — Z90710 Acquired absence of both cervix and uterus: Secondary | ICD-10-CM

## 2020-04-08 DIAGNOSIS — Z780 Asymptomatic menopausal state: Secondary | ICD-10-CM

## 2020-04-08 NOTE — Patient Instructions (Addendum)
Mantenimiento de la salud de las mujeres posmenopusicas Health Maintenance for Postmenopausal Women La menopausia es un proceso normal en el cual la capacidad de quedar embarazada llega a su fin. Este proceso ocurre lentamente a lo largo de un perodo de muchos meses o aos; por lo general, entre los 48 y los 55aos. La menopausia es completa cuando no se ha tenido el perodo menstrual por 12meses. Es importante hablar con el mdico sobre algunas de las enfermedades ms comunes que afectan a las mujeres despus de la menopausia (mujeres posmenopusicas). Estas incluyen la enfermedad cardaca, el cncer y la prdida sea (osteoporosis). Adoptar un estilo de vida saludable y recibir atencin preventiva pueden ayudar a promover la salud y el bienestar. Las medidas que tome tambin pueden reducir las probabilidades de desarrollar algunas de estas enfermedades frecuentes. Qu debo saber acerca de la menopausia? Durante la menopausia, puede tener una serie de sntomas, por ejemplo:  Acaloramiento. Estos pueden ser moderados o intensos.  Sudoracin nocturna.  Disminucin del deseo sexual.  Cambios en el estado de nimo.  Dolores de cabeza.  Cansancio.  Irritabilidad.  Problemas de memoria.  Insomnio. Tratar o no estos sntomas es una decisin que se toma con el mdico. Necesito terapia de reemplazo hormonal?  La terapia de reemplazo hormonal es eficaz para tratar los sntomas causados por la menopausia, como los acaloramientos y las sudoraciones nocturnas.  La reposicin hormonal conlleva ciertos riesgos, especialmente a medida que una mujer envejece. Si est pensando en usar estrgeno o estrgeno con progestina, analice los beneficios y los riesgos con el mdico. Cul es mi riesgo de sufrir enfermedad cardaca y accidente cerebrovascular? A medida que se envejece, aumenta el riesgo de enfermedad cardaca, infarto de miocardio y accidente cerebrovascular. Una de las causas puede ser un  cambio en las hormonas del cuerpo durante la menopausia. Esto puede afectar la forma en que el organismo procesa las grasas, los triglicridos y el colesterol de su dieta. El infarto de miocardio y el accidente cerebrovascular son emergencias mdicas. Hay muchas cosas que se pueden hacer para ayudar a prevenir la enfermedad cardaca y el accidente cerebrovascular. Contrlese la presin arterial  La hipertensin arterial causa enfermedades cardacas y aumenta el riesgo de accidente cerebrovascular. Es ms probable que esto se manifieste en las personas que tienen lecturas de presin arterial alta, tienen ascendencia africana o tienen sobrepeso.  Hgase controlar la presin arterial: ? Cada 3 a 5 aos si tiene entre 18 y 39 aos. ? Todos los aos si es mayor de 40aos. Consuma una dieta saludable   Consuma una dieta que incluya muchas verduras, frutas, productos lcteos con bajo contenido de grasa y protenas magras.  No consuma muchos alimentos ricos en grasas slidas, azcares agregados o sodio. Haga ejercicio con regularidad Haga ejercicio con regularidad. Esta es una de las prcticas ms importantes que puede hacer por su salud. La mayora de los adultos deben seguir estas pautas:  Intente realizar al menos 150minutos de actividad fsica por semana. El ejercicio debe aumentar la frecuencia cardaca y hacerlo transpirar (ejercicio de intensidad moderada).  Intente hacer ejercicios de elongacin por lo menos dos veces por semana. Agrguelos al plan de ejercicio de intensidad moderada.  Pasar menos tiempo sentados. Incluso la actividad fsica ligera puede ser beneficiosa. Otros consejos  Trabaje con su mdico para alcanzar o mantener un peso saludable.  No consuma ningn producto que contenga nicotina o tabaco, como cigarrillos, cigarrillos electrnicos y tabaco de mascar. Si necesita ayuda para dejar de fumar, consulte   al mdico.  Conozca sus cifras. Pdale al mdico que le controle el  colesterol y el nivel sanguneo de azcar en la sangre (glucosa). Siga hacindose anlisis de sangre como se lo haya indicado el mdico. Necesito realizarme pruebas de deteccin del cncer? Segn su historia clnica y sus antecedentes familiares, es posible que deba realizarse pruebas de deteccin del cncer en diferentes etapas de la vida. Esto puede incluir pruebas de deteccin de lo siguiente:  Cncer de mama.  Cncer de cuello uterino.  Cncer de pulmn.  Cncer colorrectal. Cul es mi riesgo de tener osteoporosis? Despus de la menopausia, puede correr un riesgo ms alto de tener osteoporosis. La osteoporosis es una afeccin en la cual la destruccin de la masa sea ocurre con mayor rapidez que su formacin. Para ayudar a prevenir esta afeccin o las fracturas seas que pueden ocurrir a causa de esta, usted puede tomar las siguientes medidas:  Si tiene entre 19 y 50aos, tome como mnimo 1000mg de calcio y 600mg de vitaminaD por da.  Si es mayor de 50aos pero menor de 70aos, tome como mnimo 1200mg de calcio y 600mg de vitaminaD por da.  Si es mayor de 70aos, tome como mnimo 1200mg de calcio y 800mg de vitaminaD por da. Fumar y beber alcohol en exceso aumentan el riesgo de osteoporosis. Consuma alimentos ricos en calcio y vitaminaD, y haga ejercicios con soporte de peso varias veces a la semana, como se lo haya indicado el mdico. De qu manera la menopausia afecta mi salud mental? La depresin puede presentarse a cualquier edad, pero es ms frecuente a medida que una persona envejece. Los sntomas comunes de depresin incluyen lo siguiente:  Desnimo o tristeza.  Cambios en los patrones de sueo.  Cambios en el apetito o en los hbitos de alimentacin.  Sensacin de falta general de motivacin o placer al realizar las actividades que sola disfrutar.  Crisis frecuentes de llanto. Hable con el mdico si cree que tiene depresin. Instrucciones  generales Visite a su mdico para hacerse exmenes de bienestar peridicos y aplicarse vacunas. Puede incluir:  Programar exmenes peridicos dentales, de la salud y de la vista.  Recibir y mantener las vacunas. Estos incluyen los siguientes: ? Vacuna contra la gripe. Aplquese esta vacuna todos los aos antes de que comience la temporada de gripe. ? Vacuna contra la neumona. ? Vacuna contra el herpes. ? Vacuna contra el ttanos, la difteria y la tos ferina (Tdap). El mdico tambin puede recomendarle que se aplique otras vacunas. Notifique a su mdico si alguna vez ha sido vctima de abuso o si no se siente seguro en su hogar. Resumen  La menopausia es un proceso normal en el cual la capacidad de quedar embarazada llega a su fin.  Esta condicin causa acaloramientos, sudoraciones nocturnas, disminucin del inters en el sexo, cambios en el estado de nimo, dolores de cabeza o falta de sueo.  El tratamiento de esta afeccin puede incluir una terapia de reemplazo hormonal.  Tome medidas para mantenerse sana, entre ellas, hacer ejercicio con regularidad, seguir una dieta saludable, controlar su peso y medirse la presin arterial y los niveles de azcar en la sangre.  Hgase pruebas para detectar cncer y depresin. Asegrese de estar al da con todas las vacunas. Esta informacin no tiene como fin reemplazar el consejo del mdico. Asegrese de hacerle al mdico cualquier pregunta que tenga. Document Revised: 10/03/2018 Document Reviewed: 10/03/2018 Elsevier Patient Education  2020 Elsevier Inc.  

## 2020-04-08 NOTE — Progress Notes (Signed)
   Pressley Barsky 1971-06-20 [id number]   History:  49 y.o. [id number] presents for annual exam. Spanish-speaking, interpreter present during visit. 2006 TAH for fibroids. Menopause at age 78, no HRT. Was on Vivelle 0.05 mg patch in the past and doing okay without it, some hot flashes. Normal pap and mammogram history. 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   Gynecologic History Patient's last menstrual period was 02/09/2005.   Contraception: status post hysterectomy Last Pap: 02/03/2016. Results were: normal Last mammogram: 07/19/2019. Results were: normal   Past medical history, past surgical history, family history and social history were all reviewed and documented in the EPIC chart.  ROS:  A ROS was performed and pertinent positives and negatives are included.  Exam:  Vitals:   04/08/20 0841  BP: 122/83  Weight: 124 lb 3.2 oz (56.3 kg)  Height: 5\' 1"  (1.549 m)   Body mass index is 23.47 kg/m.  General appearance:  Normal Thyroid:  Symmetrical, normal in size, without palpable masses or nodularity. Respiratory  Auscultation:  Clear without wheezing or rhonchi Cardiovascular  Auscultation:  Regular rate, without rubs, murmurs or gallops  Edema/varicosities:  Not grossly evident Abdominal  Soft,nontender, without masses, guarding or rebound.  Liver/spleen:  No organomegaly noted  Hernia:  None appreciated  Skin  Inspection:  Grossly normal   Breasts: Examined lying and sitting.   Right: Without masses, retractions, discharge or axillary adenopathy. Dense   Left: Without masses, retractions, discharge or axillary adenopathy. Dense Gentitourinary   Inguinal/mons:  Normal without inguinal adenopathy  External genitalia:  Normal  BUS/Urethra/[name]'s glands:  Normal  Vagina:  Normal, mild atrophy  Cervix:  Absent  Uterus:  Absent  Adnexa/parametria:     Rt: Without masses or tenderness.   Lt: Without masses or tenderness.  Anus and perineum: Normal  Assessment/Plan:   49 y.o. 54 for annual exam.   Well female exam with routine gynecological exam - Plan: CBC with Differential/Platelet, Comprehensive metabolic panel. Education provided on SBEs, importance of preventative screenings, current guidelines, high calcium diet, regular exercise, and multivitamin daily.   History of total abdominal hysterectomy 2006  Lipid screening - Plan: Lipid panel  Screening for osteoporosis - Plan: DG Bone Density. Baseline screening   Postmenopausal - Plan: DG Bone Density. Early menopause at age 23, no HRT. Some hot flashes but tolerable.   Follow up in 1 year for annual     41 [facility], 8:49 AM 04/08/2020

## 2020-04-09 LAB — COMPREHENSIVE METABOLIC PANEL
AG Ratio: 1.3 (calc) (ref 1.0–2.5)
ALT: 16 U/L (ref 6–29)
AST: 19 U/L (ref 10–35)
Albumin: 4.3 g/dL (ref 3.6–5.1)
Alkaline phosphatase (APISO): 86 U/L (ref 31–125)
BUN: 13 mg/dL (ref 7–25)
CO2: 28 mmol/L (ref 20–32)
Calcium: 9.2 mg/dL (ref 8.6–10.2)
Chloride: 105 mmol/L (ref 98–110)
Creat: 0.72 mg/dL (ref 0.50–1.10)
Globulin: 3.2 g/dL (calc) (ref 1.9–3.7)
Glucose, Bld: 100 mg/dL — ABNORMAL HIGH (ref 65–99)
Potassium: 3.6 mmol/L (ref 3.5–5.3)
Sodium: 141 mmol/L (ref 135–146)
Total Bilirubin: 0.9 mg/dL (ref 0.2–1.2)
Total Protein: 7.5 g/dL (ref 6.1–8.1)

## 2020-04-09 LAB — CBC WITH DIFFERENTIAL/PLATELET
Absolute Monocytes: 377 cells/uL (ref 200–950)
Basophils Absolute: 39 cells/uL (ref 0–200)
Basophils Relative: 0.8 %
Eosinophils Absolute: 29 cells/uL (ref 15–500)
Eosinophils Relative: 0.6 %
HCT: 45 % (ref 35.0–45.0)
Hemoglobin: 15 g/dL (ref 11.7–15.5)
Lymphs Abs: 2440 cells/uL (ref 850–3900)
MCH: 30.5 pg (ref 27.0–33.0)
MCHC: 33.3 g/dL (ref 32.0–36.0)
MCV: 91.5 fL (ref 80.0–100.0)
MPV: 12.2 fL (ref 7.5–12.5)
Monocytes Relative: 7.7 %
Neutro Abs: 2014 cells/uL (ref 1500–7800)
Neutrophils Relative %: 41.1 %
Platelets: 160 10*3/uL (ref 140–400)
RBC: 4.92 10*6/uL (ref 3.80–5.10)
RDW: 12.8 % (ref 11.0–15.0)
Total Lymphocyte: 49.8 %
WBC: 4.9 10*3/uL (ref 3.8–10.8)

## 2020-04-09 LAB — LIPID PANEL
Cholesterol: 174 mg/dL (ref <200)
HDL: 44 mg/dL — ABNORMAL LOW (ref >=50)
LDL Cholesterol (Calc): 102 mg/dL (calc) — ABNORMAL HIGH
Non-HDL Cholesterol (Calc): 130 mg/dL (calc) — ABNORMAL HIGH (ref <130)
Total CHOL/HDL Ratio: 4 (calc) (ref <5.0)
Triglycerides: 164 mg/dL — ABNORMAL HIGH (ref <150)

## 2020-04-21 ENCOUNTER — Ambulatory Visit (INDEPENDENT_AMBULATORY_CARE_PROVIDER_SITE_OTHER): Payer: 59

## 2020-04-21 ENCOUNTER — Other Ambulatory Visit: Payer: Self-pay

## 2020-04-21 ENCOUNTER — Other Ambulatory Visit: Payer: Self-pay | Admitting: Nurse Practitioner

## 2020-04-21 DIAGNOSIS — E28319 Asymptomatic premature menopause: Secondary | ICD-10-CM

## 2020-04-21 DIAGNOSIS — Z13820 Encounter for screening for osteoporosis: Secondary | ICD-10-CM

## 2020-04-21 DIAGNOSIS — M810 Age-related osteoporosis without current pathological fracture: Secondary | ICD-10-CM

## 2020-04-21 DIAGNOSIS — Z780 Asymptomatic menopausal state: Secondary | ICD-10-CM

## 2020-05-19 ENCOUNTER — Telehealth: Payer: Self-pay | Admitting: *Deleted

## 2020-05-19 NOTE — Telephone Encounter (Signed)
Will route to claudia to relay below in spanish.

## 2020-05-19 NOTE — Telephone Encounter (Signed)
Patient walked into office and stopped at [place] desk requesting lab results from OV on 04/08/20. I noticed the lipid profile showed abnormal levels, but a normal cholesterol. I see her other levels of lipid profile have been abnormal, but cholesterol has been within normal range for the last couple of years.  Any recommendations regarding elevated lipid levels?  Please advise

## 2020-05-19 NOTE — Telephone Encounter (Signed)
Her LDL was only elevated by 2 points and we do not treat triglycerides until >500. So low fat/low cholesterol diet and regular exercise will help with both of these.

## 2020-05-21 NOTE — Telephone Encounter (Signed)
Claudia informed patient.  °

## 2020-06-02 ENCOUNTER — Ambulatory Visit (INDEPENDENT_AMBULATORY_CARE_PROVIDER_SITE_OTHER): Payer: 59 | Admitting: Obstetrics & Gynecology

## 2020-06-02 ENCOUNTER — Encounter: Payer: Self-pay | Admitting: Obstetrics & Gynecology

## 2020-06-02 ENCOUNTER — Ambulatory Visit: Payer: 59 | Admitting: Obstetrics & Gynecology

## 2020-06-02 ENCOUNTER — Other Ambulatory Visit: Payer: Self-pay

## 2020-06-02 VITALS — BP 126/78

## 2020-06-02 DIAGNOSIS — M810 Age-related osteoporosis without current pathological fracture: Secondary | ICD-10-CM | POA: Diagnosis not present

## 2020-06-02 MED ORDER — ALENDRONATE SODIUM 70 MG PO TABS: 70.0000 mg | ORAL_TABLET | ORAL | 4 refills | Status: DC

## 2020-06-02 NOTE — Progress Notes (Signed)
    Jenna Montoya [birth year] [id number]        49 y.o.  [id number]   RP: Osteoporosis counseling and management  HPI: Bone Density 04/21/2020 showed Osteoporosis with a T-Score at -3.1 at the Lumbar Spine.  No h/o fragility fracture.  Taking Vit D supplements and Ca++.  Physically active.   OB History  Gravida Para Term Preterm AB Living  5 4 4   1 4   SAB TAB Ectopic Multiple Live Births      0   4    # Outcome Date GA Lbr Len/2nd Weight Sex Delivery Anes PTL Lv  5 AB           4 Term     F CS-Unspec  N LIV  3 Term     M Vag-Spont  N LIV  2 Term     F Vag-Spont  N LIV  1 Term     M Vag-Spont  N LIV    Past medical history,surgical history, problem list, medications, allergies, family history and social history were all reviewed and documented in the EPIC chart.   Directed ROS with pertinent positives and negatives documented in the history of present illness/assessment and plan.  Exam:  Vitals:   06/02/20 1506  BP: 126/78   General appearance:  Normal  Bone Density 04/21/2020:  Osteoporosis with T-Score of -3.1 at the Lumbar Spine.   Assessment/Plan:  49 y.o. 54   1. Age-related osteoporosis without current pathological fracture Bone density April 21, 2020 showed osteoporosis with a T score of -3.1 at the lumbar spine.  Patient had an early menopause at age 54.  Currently taking vitamin D supplements, calcium intake of 1200 to 1500 mg and regular weightbearing physical activities recommended.  Counseling on osteoporosis with the risk of fracture with minor falls explained.  Medical treatment of osteoporosis discussed.  Decision to start on Fosamax.  The risk of worsening of her GERD discussed.  Usage of Fosamax reviewed.  If Fosamax is not tolerated, patient will call to change her medical treatment.  Will probably start on Prolia if that is the case.  Risks of jaw necrosis with major dental work explained.  Recommendation made to hold Fosamax 1 month before and 1 month  after major dental work.  Patient voiced under standing and agreement.  Prescription sent to pharmacy. - VITAMIN D 25 Hydroxy (Vit-D Deficiency, Fractures)  Other orders - Probiotic Product (PROBIOTIC-10 PO); Take by mouth. - Ascorbic Acid (VITAMIN C) 1000 MG tablet; Take 1,000 mg by mouth daily. - alendronate (FOSAMAX) 70 MG tablet; Take 1 tablet (70 mg total) by mouth every 7 (seven) days. Take with a full glass of water on an empty stomach.  41 MD, 3:24 PM 06/02/2020

## 2020-06-03 LAB — VITAMIN D 25 HYDROXY (VIT D DEFICIENCY, FRACTURES): Vit D, 25-Hydroxy: 64 ng/mL (ref 30–100)

## 2020-07-27 ENCOUNTER — Encounter: Payer: Self-pay | Admitting: Nurse Practitioner

## 2020-07-31 ENCOUNTER — Telehealth: Payer: Self-pay | Admitting: Anesthesiology

## 2020-07-31 NOTE — Telephone Encounter (Signed)
Dr. Seymour Bars  Patient called asking to change her alendronate because she is having acid reflux and its causing a cough. Please advise

## 2020-07-31 NOTE — Telephone Encounter (Signed)
Patient was informed she was scheduled for nov/17 to come and have calcium level checked and consult with Dr. Seymour Bars to discuss prolia.

## 2020-07-31 NOTE — Telephone Encounter (Signed)
Will need to change to Prolia.  Check Ca++ level.  Set an appointment with me for counseling on Prolia.

## 2020-08-12 ENCOUNTER — Other Ambulatory Visit: Payer: Self-pay

## 2020-08-12 ENCOUNTER — Ambulatory Visit (INDEPENDENT_AMBULATORY_CARE_PROVIDER_SITE_OTHER): Payer: 59 | Admitting: Obstetrics & Gynecology

## 2020-08-12 ENCOUNTER — Encounter: Payer: Self-pay | Admitting: Obstetrics & Gynecology

## 2020-08-12 VITALS — BP 130/70

## 2020-08-12 DIAGNOSIS — M810 Age-related osteoporosis without current pathological fracture: Secondary | ICD-10-CM | POA: Diagnosis not present

## 2020-08-12 DIAGNOSIS — K219 Gastro-esophageal reflux disease without esophagitis: Secondary | ICD-10-CM

## 2020-08-12 NOTE — Progress Notes (Signed)
    Jenna Montoya 22-Mar-1971 [id number]        49 y.o.  [id number]   RP: Osteoporosis counseling and management  HPI: Attempted treatment with Fosamax, but not tolerating it at all.  Severe GERD on Fosamax which forced the patient to stop the therapy.  Bone Density 04/21/2020 showed Osteoporosis with a T-Score at -3.1 at the Lumbar Spine.  No h/o fragility fracture.  Taking Vit D supplements and Ca++.  Physically active.  S/P Total Hysterectomy.  Not on HRT.   OB History  Gravida Para Term Preterm AB Living  5 4 4   1 4   SAB TAB Ectopic Multiple Live Births      0   4    # Outcome Date GA Lbr Len/2nd Weight Sex Delivery Anes PTL Lv  5 AB           4 Term     F CS-Unspec  N LIV  3 Term     M Vag-Spont  N LIV  2 Term     F Vag-Spont  N LIV  1 Term     M Vag-Spont  N LIV    Past medical history,surgical history, problem list, medications, allergies, family history and social history were all reviewed and documented in the EPIC chart.   Directed ROS with pertinent positives and negatives documented in the history of present illness/assessment and plan.  Exam:  Vitals:   08/12/20 1428  BP: 130/70   General appearance:  Normal  Bone Density 04/21/2020:  Bone Density 04/21/2020:  Osteoporosis with T-Score of -3.1 at the Lumbar Spine.   Assessment/Plan:  49 y.o. 54   1. Age-related osteoporosis without current pathological fracture Osteoporosis with a T score of -3.1 at the lumbar spine on bone density April 21, 2020.  Patient was started on Fosamax but had to stop due to severe GERD.  At age 49 with a T score of -3.1 at the spine, patient needs to treat her osteoporosis.  Recommend starting on Prolia.  Prolia usage, risks and benefits thoroughly reviewed with patient.  Risks of jaw necrosis discussed and precautions reviewed.  If needs major dental work, will proceed away from Prolia injections.  Calcium level normal at 9.2 in July 2021.  Patient voiced understanding and  agreement with plan.  Prolia pamphlet given.  We will continue on vitamin D supplements, calcium intake of 1500 mg daily and regular weightbearing physical activities.  2. Gastroesophageal reflux disease, unspecified whether esophagitis present Worsened to severe GERD on Fosamax.  Had to stop Fosamax.  August 2021 MD, 2:56 PM 08/12/2020

## 2020-08-14 ENCOUNTER — Telehealth: Payer: Self-pay | Admitting: *Deleted

## 2020-08-14 NOTE — Telephone Encounter (Signed)
-----   Message from Genia Del, MD sent at 08/12/2020  2:54 PM EST ----- Regarding: Start Prolia treatment Osteoporosis.  Severe GERD on Fosamax.  Needs to change to Prolia.  Ca++ normal at 9.2 in 03/2020.

## 2020-08-14 NOTE — Telephone Encounter (Signed)
Prolia insurance verification has been sent awaiting Summary of benefits  

## 2020-08-31 NOTE — Telephone Encounter (Addendum)
Deductible $75 ($68met)  OOP MAX $2850 ($40met)  Annual exam 04/08/2020  Calcium 9.2            Date 04/08/2020  Upcoming dental procedures   Prior Authorization needed YES approved   Pt estimated Cost [phone]   Sent message to claudia pt is spanish speaking,she will set up appointment.  appt 09/11/2020  Coverage Details: 30% one dose, $30 admin fee

## 2020-09-11 ENCOUNTER — Other Ambulatory Visit: Payer: Self-pay

## 2020-09-11 ENCOUNTER — Ambulatory Visit (INDEPENDENT_AMBULATORY_CARE_PROVIDER_SITE_OTHER): Payer: 59 | Admitting: Anesthesiology

## 2020-09-11 DIAGNOSIS — M810 Age-related osteoporosis without current pathological fracture: Secondary | ICD-10-CM

## 2020-09-11 MED ORDER — DENOSUMAB 60 MG/ML ~~LOC~~ SOSY
60.0000 mg | PREFILLED_SYRINGE | Freq: Once | SUBCUTANEOUS | Status: AC
  Administered 2020-09-11: 60 mg via SUBCUTANEOUS

## 2020-11-16 ENCOUNTER — Encounter: Payer: Self-pay | Admitting: *Deleted

## 2020-11-17 ENCOUNTER — Ambulatory Visit (INDEPENDENT_AMBULATORY_CARE_PROVIDER_SITE_OTHER): Payer: BC Managed Care – PPO | Admitting: Gastroenterology

## 2020-11-17 ENCOUNTER — Encounter: Payer: Self-pay | Admitting: Gastroenterology

## 2020-11-17 ENCOUNTER — Other Ambulatory Visit: Payer: BC Managed Care – PPO

## 2020-11-17 VITALS — BP 124/70 | HR 68 | Ht 61.0 in | Wt 120.2 lb

## 2020-11-17 DIAGNOSIS — R1012 Left upper quadrant pain: Secondary | ICD-10-CM | POA: Diagnosis not present

## 2020-11-17 DIAGNOSIS — R1013 Epigastric pain: Secondary | ICD-10-CM | POA: Diagnosis not present

## 2020-11-17 DIAGNOSIS — K219 Gastro-esophageal reflux disease without esophagitis: Secondary | ICD-10-CM | POA: Diagnosis not present

## 2020-11-17 NOTE — Progress Notes (Addendum)
11/17/2020 Jenna Montoya [id number] [birth year]   HISTORY OF PRESENT ILLNESS: This is a pleasant 50 year old female who has limited past medical history.  She is primarily Spanish-speaking, but speaks English very well and understands very well.  Interpreter/translator was present during the entirety of the visit, although the services required were very minimal.  Nonetheless, she is here today with complaints of epigastric abdominal pain and left upper quadrant abdominal pain as well as reflux.  She has been referred here on this occasion by Barry Brunner, PA-C, for evaluation regarding these issues.  She tells me that she has had the same symptoms for the past 7 or 8 years.  She had an EGD in 2014 and then an EGD and colonoscopy in 2018.  She says that she was told that she had reflux both times.  She does not recall having colon polyps on her colonoscopy.  She does not recall ever being told that she had H. pylori or being treated for that.  Looks like she also had ultrasound and HIDA scan performed in the past for her gallbladder, which were unremarkable.  She tells me that she gets placed on acid reflux type medications and they seem to help for a while.  She was placed on pantoprazole 40 mg daily and Pepcid 20 mg twice daily about 3 weeks ago with some improvement so far.   Past Medical History:  Diagnosis Date  . Chronic ITP (idiopathic thrombocytopenia) (HCC)   . Iron deficiency anemia   . Migraine   . Normal vaginal delivery    3 vag deliveries  . Vitamin D deficiency    Past Surgical History:  Procedure Laterality Date  . CESAREAN SECTION  2000  . 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 She has never used smokeless tobacco. She reports that she does not drink alcohol and does not use drugs. family history includes Hypertension in her sister. Allergies  Allergen Reactions  . Flagyl [Metronidazole] Other (See Comments)     ABDOMINAL PAIN/NAUSEA  . Hydrocodone       Outpatient Encounter Medications as of 11/17/2020  Medication Sig  . Ascorbic Acid (VITAMIN C) 1000 MG tablet Take 1,000 mg by mouth daily.  . calcium carbonate (OS-CAL) 600 MG TABS Take 600 mg by mouth 2 (two) times daily with a meal.  . cholecalciferol (VITAMIN D) 1000 UNITS tablet Take 1,000 Units by mouth daily.  Marland Kitchen denosumab (PROLIA) 60 MG/ML SOSY injection Inject 60 mg into the skin every 6 (six) months.  . famotidine (PEPCID) 20 MG tablet Take 20 mg by mouth daily as needed for heartburn or indigestion.  . IRON PO Take 1 tablet by mouth daily.  . metroNIDAZOLE (METROCREAM) 0.75 % cream Apply topically 2 (two) times daily.  . pantoprazole (PROTONIX) 40 MG tablet Take 40 mg by mouth daily.  . polyethylene glycol (MIRALAX / GLYCOLAX) 17 g packet Take 17 g by mouth daily as needed.  . Probiotic Product (PROBIOTIC-10 PO) Take by mouth.  . [DISCONTINUED] alendronate (FOSAMAX) 70 MG tablet Take 1 tablet (70 mg total) by mouth every 7 (seven) days. Take with a full glass of water on an empty stomach.   No facility-administered encounter medications on file as of 11/17/2020.     REVIEW OF SYSTEMS  : All other systems reviewed and negative except where noted in the History of Present Illness.   PHYSICAL EXAM: BP 124/70   Pulse  68   Ht 5\' 1"  (1.549 m)   Wt 120 lb 3.2 oz (54.5 kg)   LMP 02/09/2005   BMI 22.71 kg/m  General: Well developed Hispanic female in no acute distress Head: Normocephalic and atraumatic Eyes:  Sclerae anicteric, conjunctiva pink. Ears: Normal auditory acuity  Lungs: Clear throughout to auscultation; no W/R/R. Heart: Regular rate and rhythm; no M/R/G. Abdomen: Soft, non-distended.  BS present.  Mild epigastric TTP. Musculoskeletal: Symmetrical with no gross deformities  Skin: No lesions on visible extremities Extremities: No edema  Neurological: Alert oriented x 4, grossly non-focal Psychological:  Alert and  cooperative. Normal mood and affect  ASSESSMENT AND PLAN: *50 year old female with complaints of epigastric abdominal pain, left upper quadrant abdominal pain, and GERD for the past 7-8 years.  She has had two EGDs performed, recalling that they both just said she had acid reflux.  It looks like she has had abdominal ultrasound and HIDA scan performed in the past.  She also reports having a colonoscopy during the time of her last EGD in 2018.  She was just placed on pantoprazole 40 mg daily and famotidine 20 mg twice daily about 3 weeks ago with some improvement.  I would have her continue those medications for now.  We'll request records of her previous procedures.  I'm going to check H. pylori serology.  If that is positive we will treat her as she does not recall ever being told that she had that or being treated for that in the past.  Pending those results may need some type of cross-sectional imaging such as a CT scan of the abdomen versus 24-hour pH study.  **Addendum: I received EGD records from [facility].  She had an EGD in September 2014 that showed a normal esophagus, gastritis, normal duodenum.  Gastric biopsies showed mild reactive gastropathy with no H. pylori.  Esophageal biopsy showed benign esophageal squamous mucosa, negative for EOE.  These records are being sent for scanning.   CC:  October 2014, PA-C

## 2020-11-17 NOTE — Patient Instructions (Signed)
If you are age 50 or older, your body mass index should be between 23-30. Your Body mass index is 22.71 kg/m. If this is out of the aforementioned range listed, please consider follow up with your Primary Care Provider.  If you are age 82 or younger, your body mass index should be between 19-25. Your Body mass index is 22.71 kg/m. If this is out of the aformentioned range listed, please consider follow up with your Primary Care Provider.   Your provider has requested that you go to the basement level for lab work before leaving today. Press "B" on the elevator. The lab is located at the first door on the left as you exit the elevator.

## 2020-11-17 NOTE — Addendum Note (Signed)
Addended by: Mariane Duval on: 11/17/2020 01:34 PM   Modules accepted: Orders

## 2020-11-18 ENCOUNTER — Other Ambulatory Visit (INDEPENDENT_AMBULATORY_CARE_PROVIDER_SITE_OTHER): Payer: BC Managed Care – PPO

## 2020-11-18 DIAGNOSIS — R1013 Epigastric pain: Secondary | ICD-10-CM

## 2020-11-18 DIAGNOSIS — R1012 Left upper quadrant pain: Secondary | ICD-10-CM

## 2020-11-18 DIAGNOSIS — K219 Gastro-esophageal reflux disease without esophagitis: Secondary | ICD-10-CM

## 2020-11-18 LAB — H. PYLORI ANTIBODY, IGG: H Pylori IgG: NEGATIVE

## 2020-11-19 ENCOUNTER — Other Ambulatory Visit: Payer: Self-pay

## 2020-11-19 DIAGNOSIS — R1013 Epigastric pain: Secondary | ICD-10-CM

## 2020-11-23 NOTE — Progress Notes (Signed)
Reviewed and agree with documentation and assessment and plan. K. Veena Nandigam , MD   

## 2020-12-01 ENCOUNTER — Telehealth: Payer: Self-pay | Admitting: Gastroenterology

## 2020-12-01 NOTE — Telephone Encounter (Signed)
Per Chilton Greathouse RN she has been advised to call scheduling and set up CT if they have not called her.  Number provided.  The order has been entered and was sent to the schedulers on 2 separate occasions to schedule.

## 2020-12-01 NOTE — Telephone Encounter (Signed)
Pt is requesting a call back from a nurse to discuss scheduling her CT scan.

## 2020-12-08 ENCOUNTER — Other Ambulatory Visit: Payer: Self-pay

## 2020-12-08 ENCOUNTER — Ambulatory Visit (HOSPITAL_COMMUNITY)
Admission: RE | Admit: 2020-12-08 | Discharge: 2020-12-08 | Disposition: A | Discharge status: Home / Self Care | Payer: BC Managed Care – PPO | Source: Ambulatory Visit | Attending: Gastroenterology | Admitting: Gastroenterology

## 2020-12-08 DIAGNOSIS — R1013 Epigastric pain: Secondary | ICD-10-CM | POA: Insufficient documentation

## 2020-12-08 MED ORDER — IOHEXOL 300 MG/ML  SOLN
100.0000 mL | Freq: Once | INTRAMUSCULAR | Status: AC | PRN
  Administered 2020-12-08: 100 mL via INTRAVENOUS

## 2020-12-14 LAB — POCT I-STAT CREATININE: Creatinine, Ser: 0.5 mg/dL (ref 0.44–1.00)

## 2020-12-15 ENCOUNTER — Telehealth: Payer: Self-pay | Admitting: Gastroenterology

## 2020-12-15 ENCOUNTER — Encounter: Payer: Self-pay | Admitting: Internal Medicine

## 2020-12-15 NOTE — Telephone Encounter (Signed)
Patient called regarding CT results and I advise her only of the EGD\Colon reports requested by Doug Sou PA-C. Per patient she does recall having an EGD in 2018 and will try to obtain report. She said she has never done a colonoscopy and is wanting to get one done.

## 2020-12-15 NOTE — Telephone Encounter (Signed)
Please let her know that her CT scan was unremarkable for any cause of her pain. It did show some findings in the bases of both of her lungs, I do not think it is anything that needs to be addressed, but she could certainly talk to her PCP about this further. The only EGD report that I got was from 2014. She says that she had another one in 2018 and had a colonoscopy at that time as well. Would she be able to obtain those records for [place] if she is sure that she had those performed in 2018?  Jenna Montoya the pt states she has never had a  colon and will try and obtain records from 2018 EGD.

## 2020-12-15 NOTE — Telephone Encounter (Signed)
Colon scheduled on 5/31 at 2:00pm with Dr. Leone Payor at [facility] due to provider's schedule availability. Ok per [employer].

## 2020-12-15 NOTE — Telephone Encounter (Signed)
Message sent to the schedulers to set up colon with Dr Lavon Paganini.

## 2020-12-15 NOTE — Telephone Encounter (Signed)
Okay.  During her visit I thought I documented that she told me she had a colonoscopy at the time of her last EGD.  She does need a colonoscopy for screening purposes since she is over the age of 41.  Please schedule with whichever doctor she was assigned to.  Thank you,  Jess

## 2021-02-05 NOTE — Telephone Encounter (Signed)
PROLIA GIVEN 09/11/2020 NEXT INJECTION 03/13/2021

## 2021-02-09 ENCOUNTER — Ambulatory Visit (AMBULATORY_SURGERY_CENTER): Payer: Self-pay | Admitting: *Deleted

## 2021-02-09 ENCOUNTER — Other Ambulatory Visit: Payer: Self-pay

## 2021-02-09 VITALS — Ht 61.0 in | Wt 122.0 lb

## 2021-02-09 DIAGNOSIS — Z1211 Encounter for screening for malignant neoplasm of colon: Secondary | ICD-10-CM

## 2021-02-09 NOTE — Progress Notes (Signed)
Patient and interpreter is here in-person for PV. Patient denies any allergies to eggs or soy. Patient denies any problems with anesthesia/sedation. Patient denies any oxygen use at home. Patient denies taking any diet/weight loss medications or blood thinners. Patient is not being treated for MRSA or C-diff. Patient is aware of our care-partner policy and Covid-19 safety protocol. EMMI education assigned to the patient for the procedure, sent to MyChart.  Patient is aware NOT to take metformin the day of procedure.

## 2021-02-23 ENCOUNTER — Encounter: Payer: Self-pay | Admitting: Internal Medicine

## 2021-02-23 ENCOUNTER — Other Ambulatory Visit: Payer: Self-pay

## 2021-02-23 ENCOUNTER — Ambulatory Visit (AMBULATORY_SURGERY_CENTER): Payer: BC Managed Care – PPO | Admitting: Internal Medicine

## 2021-02-23 VITALS — BP 109/71 | HR 60 | Temp 98.9°F | Resp 10 | Ht 61.0 in | Wt 122.0 lb

## 2021-02-23 DIAGNOSIS — Z1211 Encounter for screening for malignant neoplasm of colon: Secondary | ICD-10-CM | POA: Diagnosis present

## 2021-02-23 MED ORDER — SODIUM CHLORIDE 0.9 % IV SOLN
500.0000 mL | Freq: Once | INTRAVENOUS | Status: DC
Start: 2021-02-23 — End: 2021-02-23

## 2021-02-23 NOTE — Progress Notes (Signed)
Pt was asleep when she arrived in the recovery room.  I asked pt's husband if they needed and interprepter and he replied, "no".   No problems noted in the recovery room.  I asked if they would like AVS in spanish and husband said "yes". AVS was given in english and spanish.  Pt's sugar was 84.  I told her and her spouse to make sure she eats when she gets home before taking her meds.  They both said they understood.  maw

## 2021-02-23 NOTE — Patient Instructions (Addendum)
The colonoscopy was normal  - excellent news.  Glad your stomach is feeling better.  Please follow-up as needed.  Next routine colonoscopy or other screening test in 10 years - 2032.  I appreciate the opportunity to care for you. Iva Boop, MD, [facility]    You may resume your current medications today. Your sugar was 84 in the recovery room. Please call if any questions or concerns.      YOU HAD AN ENDOSCOPIC PROCEDURE TODAY AT THE [facility]:   Refer to the procedure report that was given to you for any specific questions about what was found during the examination.  If the procedure report does not answer your questions, please call your gastroenterologist to clarify.  If you requested that your care partner not be given the details of your procedure findings, then the procedure report has been included in a sealed envelope for you to review at your convenience later.  YOU SHOULD EXPECT: Some feelings of bloating in the abdomen. Passage of more gas than usual.  Walking can help get rid of the air that was put into your GI tract during the procedure and reduce the bloating. If you had a lower endoscopy (such as a colonoscopy or flexible sigmoidoscopy) you may notice spotting of blood in your stool or on the toilet paper. If you underwent a bowel prep for your procedure, you may not have a normal bowel movement for a few days.  Please Note:  You might notice some irritation and congestion in your nose or some drainage.  This is from the oxygen used during your procedure.  There is no need for concern and it should clear up in a day or so.  SYMPTOMS TO REPORT IMMEDIATELY:   Following lower endoscopy (colonoscopy or flexible sigmoidoscopy):  Excessive amounts of blood in the stool  Significant tenderness or worsening of abdominal pains  Swelling of the abdomen that is new, acute  Fever of 100F or higher    For urgent or emergent issues, a gastroenterologist can be  reached at any hour by calling [phone]. Do not use MyChart messaging for urgent concerns.    DIET:  We do recommend a small meal at first, but then you may proceed to your regular diet.  Drink plenty of fluids but you should avoid alcoholic beverages for 24 hours.  ACTIVITY:  You should plan to take it easy for the rest of today and you should NOT DRIVE or use heavy machinery until tomorrow (because of the sedation medicines used during the test).    FOLLOW UP: Our staff will call the number listed on your records 48-72 hours following your procedure to check on you and address any questions or concerns that you may have regarding the information given to you following your procedure. If we do not reach you, we will leave a message.  We will attempt to reach you two times.  During this call, we will ask if you have developed any symptoms of COVID 19. If you develop any symptoms (ie: fever, flu-like symptoms, shortness of breath, cough etc.) before then, please call [phone].  If you test positive for Covid 19 in the 2 weeks post procedure, please call and report this information to [place].    If any biopsies were taken you will be contacted by phone or by letter within the next 1-3 weeks.  Please call us at [phone] if you have not heard about the biopsies in 3 weeks.  SIGNATURES/CONFIDENTIALITY: You and/or your care partner have signed paperwork which will be entered into your electronic medical record.  These signatures attest to the fact that that the information above on your A fter Visit Summary has been reviewed and is understood.  Full responsibility of the confidentiality of this discharge information lies with you and/or your care-partner.       USTED TUVO UN PROCEDIMIENTO ENDOSCPICO HOY EN EL [facility]:   Lea el informe del procedimiento que se le entreg para cualquier pregunta especfica sobre lo que se Dentist.  Si el informe del  examen no responde a sus preguntas, por favor llame a su gastroenterlogo para aclararlo.  Si usted solicit que no se le den [employer] de lo que se Clinical cytogeneticist en su procedimiento al [employer] va a cuidar, entonces el informe del procedimiento se ha incluido en un sobre sellado para que usted lo revise despus cuando le sea ms conveniente.   LO QUE PUEDE ESPERAR: Algunas sensaciones de hinchazn en el abdomen.  Puede tener ms gases de lo normal.  El caminar puede ayudarle a eliminar el aire que se le puso en el tracto gastrointestinal durante el procedimiento y reducir la hinchazn.  Si le hicieron una endoscopia inferior (como una colonoscopia o una sigmoidoscopia flexible), podra notar manchas de sangre en las heces fecales o en el papel higinico.  Si se someti a una preparacin intestinal para su procedimiento, es posible que no tenga una evacuacin intestinal normal durante [employer].   Tenga en cuenta:  Es posible que note un poco de irritacin y congestin en la nariz o algn drenaje.  Esto es debido al oxgeno [employer] durante su procedimiento.  No hay que preocuparse y esto debe desaparecer ms o Regulatory affairs officer.   SNTOMAS PARA REPORTAR INMEDIATAMENTE:  Despus de una endoscopia inferior (colonoscopia o sigmoidoscopia flexible):  Cantidades excesivas de sangre en las heces fecales  Sensibilidad significativa o empeoramiento de los dolores abdominales   Hinchazn aguda del abdomen que antes no tena   Fiebre de 100F o ms     Para asuntos urgentes o de Associate Professor, puede comunicarse con un gastroenterlogo a cualquier hora llamando al [phone].  DIETA:  Recomendamos una comida pequea al principio, pero luego puede continuar con su dieta normal.  Tome muchos lquidos, Tax adviser las bebidas alcohlicas durante 24 horas.    ACTIVIDAD:  Debe planear tomarse las cosas con calma por el resto del da y no debe CONDUCIR ni usar maquinaria pesada Patent examiner (debido a  los medicamentos de sedacin utilizados durante el examen).     SEGUIMIENTO: Nuestro personal llamar al nmero que aparece en su historial al siguiente da hbil de su procedimiento para ver cmo se siente y para responder cualquier pregunta o inquietud que pueda tener con respecto a la informacin que se le dio despus del procedimiento. Si no podemos contactarle, le dejaremos un mensaje.  Sin embargo, si se siente bien y no tiene English as a second language teacher, no es necesario que nos devuelva la llamada.  Asumiremos que ha regresado a sus actividades diarias normales sin incidentes. Si se le tomaron algunas biopsias, le contactaremos por telfono o por carta en las prximas 3 semanas.  Si no ha sabido [employer] biopsias en el transcurso de 3 semanas, por favor llmenos al [phone].   FIRMAS/CONFIDENCIALIDAD: Usted y/o el acompaante que le cuide han firmado documentos que se ingresarn en su historial mdico electrnico.  Estas firmas atestiguan el hecho de que la informacin anterior

## 2021-02-23 NOTE — Op Note (Signed)
[facility] Patient Name: Jenna Montoya Procedure Date: 02/23/2021 1:56 PM MRN: [id number] Endoscopist: Iva Boop , MD Age: 50 Referring MD:  Date of Birth: 20-Dec-1970 Gender: Female Account #: [id number] Procedure:                Colonoscopy Indications:              Screening for colorectal malignant neoplasm, This                            is the patient's first colonoscopy Medicines:                Propofol per Anesthesia, Monitored Anesthesia Care Procedure:                Pre-Anesthesia Assessment:                           - Prior to the procedure, a History and Physical                            was performed, and patient medications and                            allergies were reviewed. The patient's tolerance of                            previous anesthesia was also reviewed. The risks                            and benefits of the procedure and the sedation                            options and risks were discussed with the patient.                            All questions were answered, and informed consent                            was obtained. Prior Anticoagulants: The patient has                            taken no previous anticoagulant or antiplatelet                            agents. ASA Grade Assessment: II - A patient with                            mild systemic disease. After reviewing the risks                            and benefits, the patient was deemed in                            satisfactory condition to undergo the procedure.  After obtaining informed consent, the colonoscope                            was passed under direct vision. Throughout the                            procedure, the patient's blood pressure, pulse, and                            oxygen saturations were monitored continuously. The                            Olympus CF-HQ190 [id number]) Colonoscope was                             introduced through the anus and advanced to the the                            cecum, identified by appendiceal orifice and                            ileocecal valve. The colonoscopy was performed                            without difficulty. The patient tolerated the                            procedure well. The quality of the bowel                            preparation was excellent. The ileocecal valve,                            appendiceal orifice, and rectum were photographed.                            The bowel preparation used was Miralax via split                            dose instruction. Scope In: 2:03:27 PM Scope Out: 2:16:02 PM Scope Withdrawal Time: 0 hours 9 minutes 51 seconds  Total Procedure Duration: 0 hours 12 minutes 35 seconds  Findings:                 The perianal and digital rectal examinations were                            normal.                           The entire examined colon appeared normal on direct                            and retroflexion views. Complications:            No immediate complications. Estimated Blood  Loss:     Estimated blood loss: none. Impression:               - The entire examined colon is normal on direct and                            retroflexion views.                           - No specimens collected. Recommendation:           - Patient has a contact number available for                            emergencies. The signs and symptoms of potential                            delayed complications were discussed with the                            patient. Return to normal activities tomorrow.                            Written discharge instructions were provided to the                            patient.                           - Resume previous diet.                           - Continue present medications.                           - Repeat colonoscopy in 10 years for screening                             purposes.                           Epigastric pain is improved/better - see GI prn Iva Boop, MD 02/23/2021 2:21:34 PM This report has been signed electronically.

## 2021-02-23 NOTE — Progress Notes (Signed)
Pt's states no medical or surgical changes since previsit or office visit. 

## 2021-02-23 NOTE — Progress Notes (Signed)
pt tolerated well. VSS. awake and to recovery. Report given to RN.  

## 2021-02-25 ENCOUNTER — Telehealth: Payer: Self-pay

## 2021-02-25 NOTE — Telephone Encounter (Signed)
  Follow up Call-  Call back number 02/23/2021  Post procedure Call Back phone  # [phone]  Permission to leave phone message Yes  Some recent data might be hidden     Patient questions:  Do you have a fever, pain , or abdominal swelling? No. Pain Score  0 *  Have you tolerated food without any problems? Yes.    Have you been able to return to your normal activities? Yes.    Do you have any questions about your discharge instructions: Diet   No. Medications  No. Follow up visit  No.  Do you have questions or concerns about your Care? No.  Actions: * If pain score is 4 or above: No action needed, pain <4.  1. Have you developed a fever since your procedure? no  2.   Have you had an respiratory symptoms (SOB or cough) since your procedure? no  3.   Have you tested positive for COVID 19 since your procedure no  4.   Have you had any family members/close contacts diagnosed with the COVID 19 since your procedure?  no   If yes to any of these questions please route to Laverna Peace, RN and Karlton Lemon, RN

## 2021-02-26 ENCOUNTER — Telehealth: Payer: Self-pay | Admitting: *Deleted

## 2021-02-26 NOTE — Telephone Encounter (Signed)
Pt has new Barista has been sent awaiting Summary of benefits

## 2021-03-01 NOTE — Telephone Encounter (Addendum)
Deductible $2500 [phone])  OOP MAX  $4500 [phone])  Annual exam 04/08/2020  Calcium 9.2            Date 04/08/2020  Upcoming dental procedures   Prior Authorization needed NO  Pt estimated Cost $838.53   Sent message to [facility] or Medical Interpretor for her assistance to go over benefits and cost     Coverage Details: 20% one dose, 20% admin fee

## 2021-03-22 ENCOUNTER — Ambulatory Visit (INDEPENDENT_AMBULATORY_CARE_PROVIDER_SITE_OTHER): Payer: BC Managed Care – PPO | Admitting: Anesthesiology

## 2021-03-22 ENCOUNTER — Other Ambulatory Visit: Payer: Self-pay

## 2021-03-22 DIAGNOSIS — M810 Age-related osteoporosis without current pathological fracture: Secondary | ICD-10-CM | POA: Diagnosis not present

## 2021-03-22 MED ORDER — DENOSUMAB 60 MG/ML ~~LOC~~ SOSY
60.0000 mg | PREFILLED_SYRINGE | Freq: Once | SUBCUTANEOUS | Status: AC
  Administered 2021-03-22: 60 mg via SUBCUTANEOUS

## 2021-04-27 ENCOUNTER — Ambulatory Visit: Payer: Medicaid Other | Admitting: Obstetrics & Gynecology

## 2021-04-30 ENCOUNTER — Encounter (HOSPITAL_COMMUNITY): Payer: Self-pay | Admitting: Emergency Medicine

## 2021-04-30 ENCOUNTER — Emergency Department (HOSPITAL_COMMUNITY)
Admission: EM | Admit: 2021-04-30 | Discharge: 2021-04-30 | Disposition: A | Discharge status: Home / Self Care | Payer: BC Managed Care – PPO | Attending: Emergency Medicine | Admitting: Emergency Medicine

## 2021-04-30 ENCOUNTER — Other Ambulatory Visit: Payer: Self-pay

## 2021-04-30 ENCOUNTER — Emergency Department (HOSPITAL_COMMUNITY): Payer: BC Managed Care – PPO

## 2021-04-30 DIAGNOSIS — S4991XA Unspecified injury of right shoulder and upper arm, initial encounter: Secondary | ICD-10-CM

## 2021-04-30 DIAGNOSIS — S43401A Unspecified sprain of right shoulder joint, initial encounter: Secondary | ICD-10-CM

## 2021-04-30 DIAGNOSIS — Z7984 Long term (current) use of oral hypoglycemic drugs: Secondary | ICD-10-CM | POA: Diagnosis not present

## 2021-04-30 DIAGNOSIS — S4391XA Sprain of unspecified parts of right shoulder girdle, initial encounter: Secondary | ICD-10-CM | POA: Insufficient documentation

## 2021-04-30 DIAGNOSIS — X501XXA Overexertion from prolonged static or awkward postures, initial encounter: Secondary | ICD-10-CM | POA: Insufficient documentation

## 2021-04-30 DIAGNOSIS — E119 Type 2 diabetes mellitus without complications: Secondary | ICD-10-CM | POA: Insufficient documentation

## 2021-04-30 DIAGNOSIS — M25511 Pain in right shoulder: Secondary | ICD-10-CM | POA: Diagnosis not present

## 2021-04-30 DIAGNOSIS — M25519 Pain in unspecified shoulder: Secondary | ICD-10-CM

## 2021-04-30 MED ORDER — KETOROLAC TROMETHAMINE 60 MG/2ML IM SOLN
60.0000 mg | Freq: Once | INTRAMUSCULAR | Status: AC
  Administered 2021-04-30: 60 mg via INTRAMUSCULAR
  Filled 2021-04-30: qty 2

## 2021-04-30 MED ORDER — METHOCARBAMOL 500 MG PO TABS: 500.0000 mg | ORAL_TABLET | Freq: Two times a day (BID) | ORAL | 0 refills | Status: DC

## 2021-04-30 MED ORDER — LIDOCAINE 5 % EX PTCH: 1.0000 | MEDICATED_PATCH | CUTANEOUS | 0 refills | Status: DC

## 2021-04-30 MED ORDER — HYDROCODONE-ACETAMINOPHEN 5-325 MG PO TABS
1.0000 | ORAL_TABLET | Freq: Once | ORAL | Status: AC
  Administered 2021-04-30: 1 via ORAL
  Filled 2021-04-30: qty 1

## 2021-04-30 MED ORDER — ACETAMINOPHEN 325 MG PO TABS: 650.0000 mg | ORAL_TABLET | Freq: Four times a day (QID) | ORAL | 0 refills | Status: AC | PRN

## 2021-04-30 MED ORDER — LIDOCAINE 5 % EX PTCH
1.0000 | MEDICATED_PATCH | CUTANEOUS | Status: DC
  Administered 2021-04-30: 1 via TRANSDERMAL
  Filled 2021-04-30: qty 1

## 2021-04-30 MED ORDER — ORPHENADRINE CITRATE 30 MG/ML IJ SOLN
60.0000 mg | Freq: Once | INTRAMUSCULAR | Status: AC
  Administered 2021-04-30: 60 mg via INTRAMUSCULAR
  Filled 2021-04-30 (×2): qty 2

## 2021-04-30 MED ORDER — IBUPROFEN 600 MG PO TABS: 600.0000 mg | ORAL_TABLET | Freq: Four times a day (QID) | ORAL | 0 refills | Status: AC | PRN

## 2021-04-30 MED ORDER — ONDANSETRON 4 MG PO TBDP
8.0000 mg | ORAL_TABLET | Freq: Once | ORAL | Status: AC
  Administered 2021-04-30: 8 mg via ORAL
  Filled 2021-04-30: qty 2

## 2021-04-30 NOTE — ED Provider Notes (Signed)
[facility] EMERGENCY DEPARTMENT Provider Note   CSN: [id number] Arrival date & time: 04/30/21  0408     History Chief Complaint  Patient presents with   Shoulder/Back/Neck Pain     Jenna Montoya is a 50 y.o. female.  This is a 50 year old right handed female with history as below presenting to the emergency department secondary to right shoulder pain.  Patient reports ongoing aching to her right shoulder over the past week.  Reports that yesterday evening she was attempting to wash her hair and she felt a sharp pain to her right shoulder.  Pain described as sharp/stabbing. She took a single dose of Tylenol which did not significantly improve her symptoms.  Denies numbness, tingling, weakness.  Difficulty performing ADLs secondary to right shoulder pain.  No prior surgical history to right shoulder.  No recent falls, no fevers, chills, recent spinal injections.  No IV drug use.  No neurologic changes.   Patient was offered interpreter services, she prefers to speak on her own.      The history is provided by the patient. No language interpreter was used.  Shoulder Pain Location:  Shoulder Pain details:    Quality:  Aching, cramping and sharp   Radiates to:  R upper arm   Onset quality:  Sudden Associated symptoms: back pain and neck pain   Associated symptoms: no fever       Past Medical History:  Diagnosis Date   Chronic ITP (idiopathic thrombocytopenia) (HCC)    Diabetes mellitus without complication (HCC)    Iron deficiency anemia    Migraine    Normal vaginal delivery    3 vag deliveries   Vitamin D deficiency     Patient Active Problem List   Diagnosis Date Noted   Gastroesophageal reflux disease 11/17/2020   LUQ abdominal pain 11/17/2020   History of total abdominal hysterectomy 04/08/2020   Postmenopausal 04/08/2020   Epigastric abdominal pain 10/18/2012   Chronic ITP (idiopathic thrombocytopenia) (HCC) 08/30/2011    Past Surgical  History:  Procedure Laterality Date   CESAREAN SECTION  2000   TOTAL ABDOMINAL HYSTERECTOMY  2006   atypical leiomyoma   UPPER GASTROINTESTINAL ENDOSCOPY  2014     OB History     Gravida  5   Para  4   Term  4   Preterm      AB  1   Living  4      SAB      IAB      Ectopic  0   Multiple      Live Births  4           Family History  Problem Relation Age of Onset   Hypertension Sister    Colon cancer Neg Hx    Esophageal cancer Neg Hx    Pancreatic cancer Neg Hx    Stomach cancer Neg Hx     Social History   Tobacco Use   Smoking status: Never   Smokeless tobacco: Never  Vaping Use   Vaping Use: Never used  Substance Use Topics   Alcohol use: No    Alcohol/week: 0.0 standard drinks   Drug use: No    Home Medications Prior to Admission medications   Medication Sig Start Date End Date Taking? Authorizing Provider  acetaminophen (TYLENOL) 325 MG tablet Take 2 tablets (650 mg total) by mouth every 6 (six) hours as needed for up to 7 days. 04/30/21 05/07/21 Yes Sloan Leiter, DO  ibuprofen (ADVIL) 600 MG tablet Take 1 tablet (600 mg total) by mouth every 6 (six) hours as needed. 04/30/21  Yes Tanda Rockers A, DO  lidocaine (LIDODERM) 5 % Place 1 patch onto the skin daily. Remove & Discard patch within 12 hours or as directed by MD 04/30/21  Yes Sloan Leiter, DO  methocarbamol (ROBAXIN) 500 MG tablet Take 1 tablet (500 mg total) by mouth 2 (two) times daily. 04/30/21  Yes Sloan Leiter, DO  Ascorbic Acid (VITAMIN C) 1000 MG tablet Take 1,000 mg by mouth daily.    [provider]  calcium carbonate (OS-CAL) 600 MG TABS Take 600 mg by mouth 2 (two) times daily with a meal.    [provider]  cholecalciferol (VITAMIN D) 1000 UNITS tablet Take 1,000 Units by mouth daily.    [provider]  denosumab (PROLIA) 60 MG/ML SOSY injection Inject 60 mg into the skin every 6 (six) months. Patient not taking: No sig reported    [provider]  IRON PO Take 1 tablet by mouth daily.    [provider]  MAGNESIUM PO Take by mouth.    [provider]  metFORMIN (GLUCOPHAGE-XR) 500 MG 24 hr tablet Take 500 mg by mouth daily. 11/11/20   [provider]  metroNIDAZOLE (METROCREAM) 0.75 % cream Apply topically 2 (two) times daily.    [provider]  Omega-3 1000 MG CAPS Take by mouth.    [provider]  pantoprazole (PROTONIX) 40 MG tablet Take 40 mg by mouth daily. 07/17/20   [provider]  Probiotic Product (PROBIOTIC-10 PO) Take by mouth.    [provider]    Allergies    Flagyl [metronidazole] and Hydrocodone  Review of Systems   Review of Systems  Constitutional:  Negative for chills and fever.  HENT:  Negative for facial swelling and trouble swallowing.   Eyes:  Negative for photophobia and visual disturbance.  Respiratory:  Negative for cough and shortness of breath.   Cardiovascular:  Negative for chest pain and palpitations.  Gastrointestinal:  Negative for abdominal pain, nausea and vomiting.  Endocrine: Negative for polydipsia and polyuria.  Genitourinary:  Negative for difficulty urinating and hematuria.  Musculoskeletal:  Positive for arthralgias, back pain and neck pain. Negative for gait problem and joint swelling.  Skin:  Negative for pallor and rash.  Neurological:  Negative for syncope and headaches.  Psychiatric/Behavioral:  Negative for agitation and confusion.    Physical Exam Updated Vital Signs BP (!) 121/59 (BP Location: Left Arm)   Pulse 66   Temp 99.4 F (37.4 C) (Oral)   Resp 16   LMP 02/09/2005   SpO2 100%   Physical Exam Vitals and nursing note reviewed.  Constitutional:      General: She is not in acute distress.    Appearance: Normal appearance.  HENT:     Head: Normocephalic and atraumatic.     Right Ear: External ear normal.     Left Ear: External ear normal.     Nose: Nose normal.     Mouth/Throat:      Mouth: Mucous membranes are moist.  Eyes:     General: No scleral icterus.       Right eye: No discharge.        Left eye: No discharge.  Cardiovascular:     Rate and Rhythm: Normal rate and regular rhythm.     Pulses: Normal pulses.     Heart sounds: Normal  heart sounds.     Comments: 2+ radial pulse equal/symmetric b/l Pulmonary:     Effort: Pulmonary effort is normal. No respiratory distress.     Breath sounds: Normal breath sounds.  Abdominal:     General: Abdomen is flat.     Tenderness: There is no abdominal tenderness.  Musculoskeletal:        General: Normal range of motion.       Arms:     Cervical back: Normal range of motion.     Right lower leg: No edema.     Left lower leg: No edema.     Comments: No midline spinous process tenderness to palpation, no crepitus or step-off.   Skin:    General: Skin is warm and dry.     Capillary Refill: Capillary refill takes less than 2 seconds.  Neurological:     Mental Status: She is alert and oriented to person, place, and time.     GCS: GCS eye subscore is 4. GCS verbal subscore is 5. GCS motor subscore is 6.     Sensory: No sensory deficit.     Motor: Motor function is intact. No weakness.     Comments: Normal muscle bulk.   Psychiatric:        Mood and Affect: Mood normal.        Behavior: Behavior normal.    ED Results / Procedures / Treatments   Labs (all labs ordered are listed, but only abnormal results are displayed) Labs Reviewed - No data to display  EKG None  Radiology DG Shoulder Right  Result Date: 04/30/2021 CLINICAL DATA:  Right shoulder pain. EXAM: RIGHT SHOULDER - 2+ VIEW COMPARISON:  None. FINDINGS: There is no evidence of fracture or dislocation. There is no evidence of arthropathy or other focal bone abnormality. Soft tissues are unremarkable. IMPRESSION: Negative. Electronically Signed   By: Marlan Palau M.D.   On: 04/30/2021 09:06    Procedures Procedures   Medications Ordered in  ED Medications  lidocaine (LIDODERM) 5 % 1 patch (1 patch Transdermal Patch Applied 04/30/21 0907)  ketorolac (TORADOL) injection 60 mg (60 mg Intramuscular Given 04/30/21 0907)  orphenadrine (NORFLEX) injection 60 mg (60 mg Intramuscular Given 04/30/21 1014)  ondansetron (ZOFRAN-ODT) disintegrating tablet 8 mg (8 mg Oral Given 04/30/21 1022)  HYDROcodone-acetaminophen (NORCO/VICODIN) 5-325 MG per tablet 1 tablet (1 tablet Oral Given 04/30/21 1022)    ED Course  I have reviewed the triage vital signs and the nursing notes.  Pertinent labs & imaging results that were available during my care of the patient were reviewed by me and considered in my medical decision making (see chart for details).    MDM Rules/Calculators/A&P                         This is a 50yo female presenting to the ED for evaluation of right sided shoulder discomfort following washing her hair. Point tenderness on exam to superior aspect of right shoulder with difficulty holding arm over her head 2/2 pain. Neuro exam is stable, 2+ radial pulse b/l. No external evidence of trauma. She is overall well appearing, vital signs are stable.Serious etiology considered.  Physical exam findings concerning for possible rotator cuff injury/shoulder [place]. She is neurovascularly intact, her pain has improved following intervention in the ED.   Placed in right shoulder sling. Discussed ROM exercises and stretching to avoid frozen shoulder. Pulse remains intact equal and symmetric b/l 2+ radial following sling  application.   Recommend the patient follow up with orthopedics within one week and with her PCP in the next 24/48 hrs.  Work note provided.   Patient in no acute distress and condition overall has improved. She is stable for discharge. Discussed strict return precautions and discussed outpatient treatment plan to which she understands and agrees. Advised to f/u with PCP and orthopedics. Discharged in stable condition.     Final  Clinical Impression(s) / ED Diagnoses Final diagnoses:  Shoulder pain  Injury of right shoulder, initial encounter  Sprain of right shoulder, unspecified shoulder sprain type, initial encounter    Rx / DC Orders ED Discharge Orders          Ordered    ibuprofen (ADVIL) 600 MG tablet  Every 6 hours PRN        04/30/21 1036    acetaminophen (TYLENOL) 325 MG tablet  Every 6 hours PRN        04/30/21 1036    lidocaine (LIDODERM) 5 %  Every 24 hours        04/30/21 1036    methocarbamol (ROBAXIN) 500 MG tablet  2 times daily        04/30/21 1037             Tanda RockersGray, Debie Ashline A, DO 04/30/21 1058

## 2021-04-30 NOTE — ED Notes (Signed)
Reviewed discharge instructions with patient. Follow-up care and medications reviewed. Patient  verbalized understanding. Patient A&Ox4, VSS, and ambulatory with steady gait upon discharge.  °

## 2021-04-30 NOTE — ED Triage Notes (Signed)
Patient arrived with PTAR from home reports right shoulder/mid back/posterior neck pain onset yesterday , denies injury or fall , pain increases with movement /changing positions .

## 2021-04-30 NOTE — ED Notes (Signed)
Pt transported to Xray at this time.

## 2021-04-30 NOTE — Progress Notes (Signed)
Orthopedic Tech Progress Note Patient Details:  Jenna Montoya 10/29/1970 [id number]   Ortho Devices Type of Ortho Device: Shoulder immobilizer Ortho Device/Splint Location: RUE Ortho Device/Splint Interventions: Ordered, Application, Adjustment   Post Interventions Patient Tolerated: Well Instructions Provided: Care of device, Adjustment of device  [name] Carmine Savoy 04/30/2021, 1:10 PM

## 2021-04-30 NOTE — ED Notes (Signed)
Called ortho to notify of shoulder immobilizer order

## 2021-04-30 NOTE — ED Notes (Signed)
Pt back from X-ray.  

## 2021-05-01 ENCOUNTER — Telehealth: Payer: Self-pay

## 2021-05-01 NOTE — Telephone Encounter (Signed)
Son called to say that the patient has a allergic reaction is having nausea after taking robaxin. Instructed to call PCP ion Monday to see if there is another medication that would work better, the doctor that saw her is not on, If she feels worse she is welcome to come in and be seen again

## 2021-05-03 IMAGING — CT CT ABDOMEN W/ CM
3 of 5 series · 17 of 46 positions shown, 19 images · IV contrast (APPLIED)
Comparison: None.

CLINICAL DATA: Epigastric left abdominal pain and loss of appetite.

EXAM:
CT ABDOMEN WITH CONTRAST
TECHNIQUE: Multidetector CT imaging of the abdomen was performed using the
standard protocol following bolus administration of intravenous
contrast.
CONTRAST:  100mL OMNIPAQUE IOHEXOL 300 MG/ML  SOLN

[Series 2: axial st · axial · 0.75mm/px · z∈[-262,-32]mm · 10 of 58 slices shown, 12 images]
[im 6/58  soft-tissue]
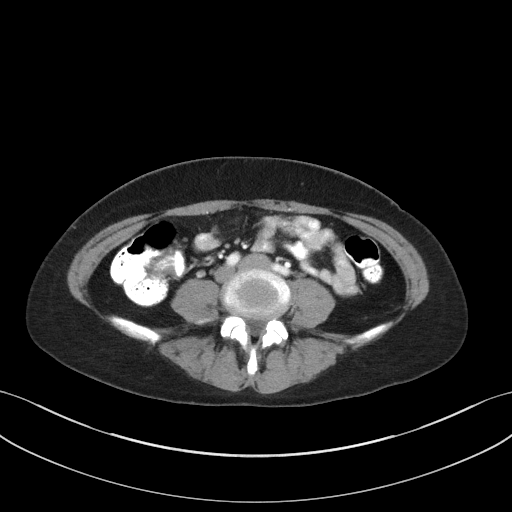
[im 6/58  bone]
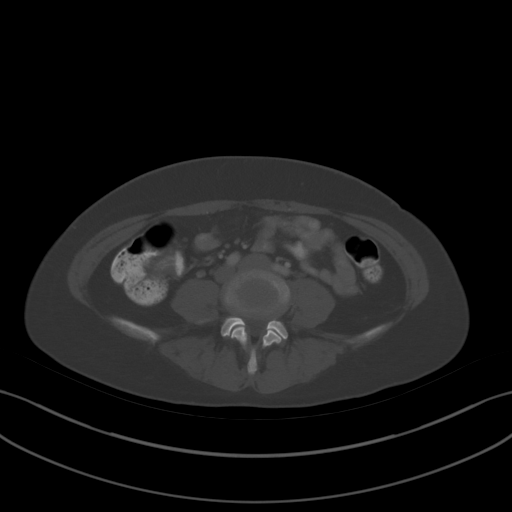
[im 11/58  soft-tissue]
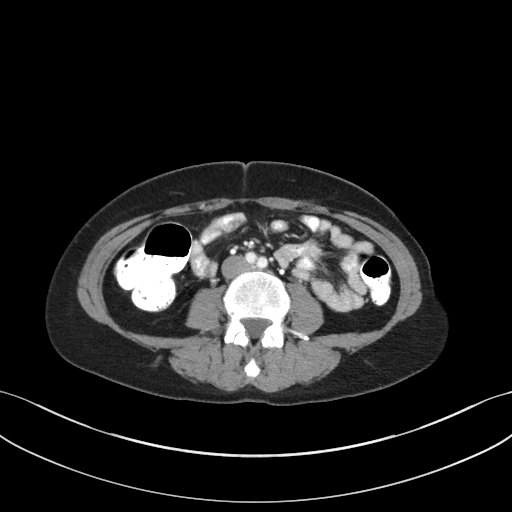
[im 16/58  soft-tissue]
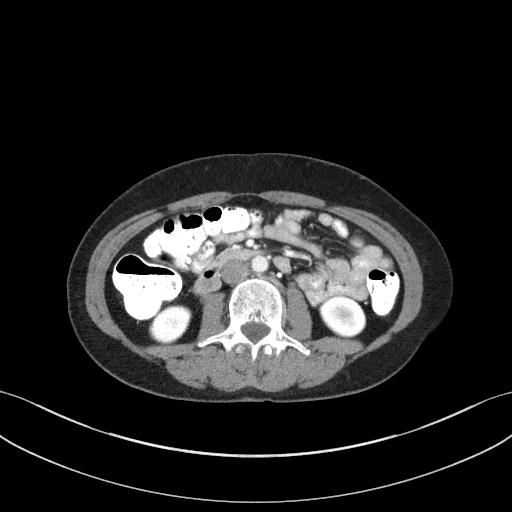
[im 21/58  soft-tissue]
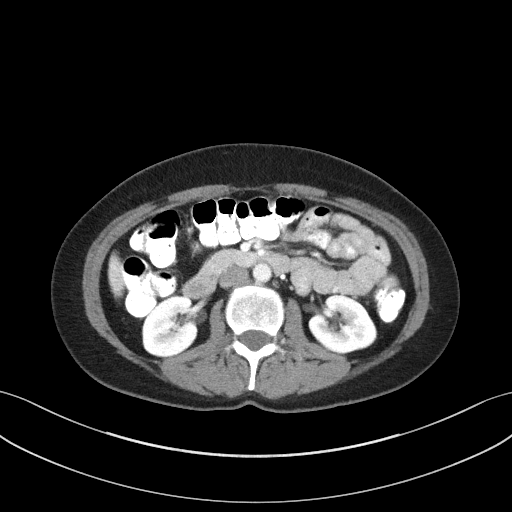
[im 26/58  soft-tissue]
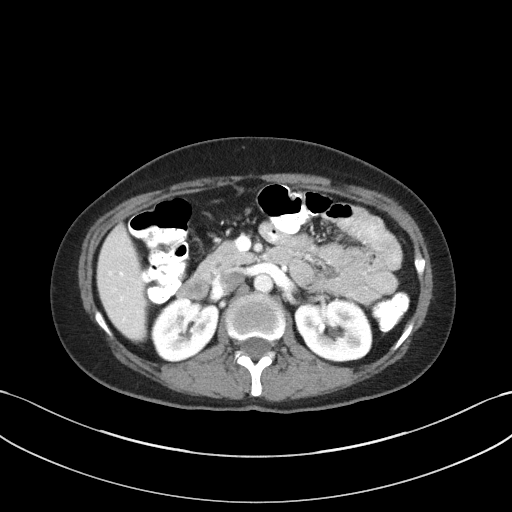
[im 32/58  soft-tissue]
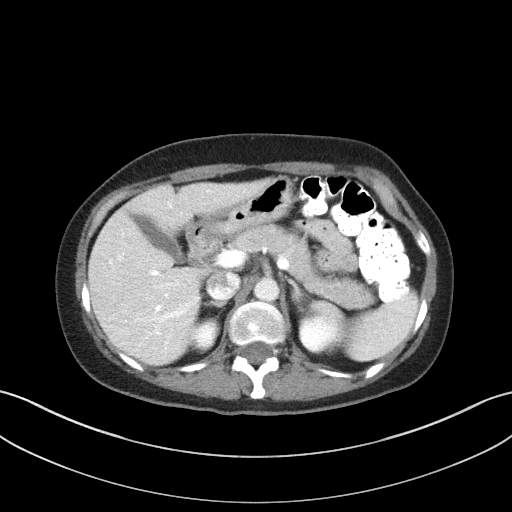
[im 37/58  soft-tissue]
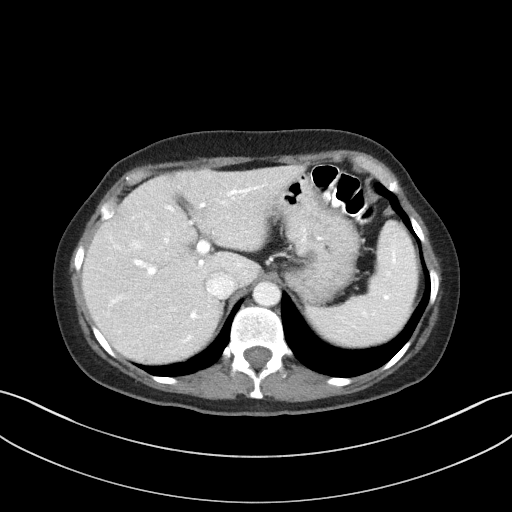
[im 42/58  soft-tissue]
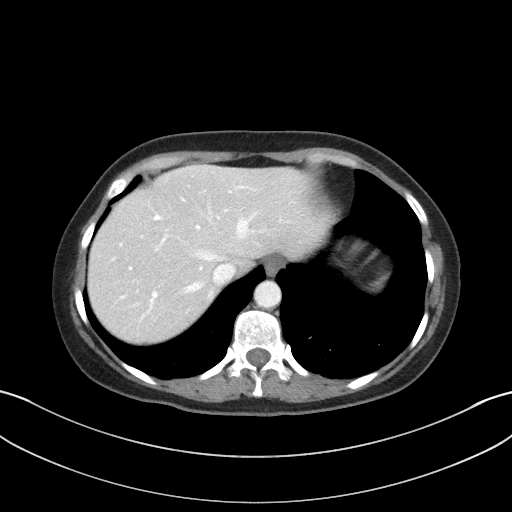
[im 47/58  soft-tissue]
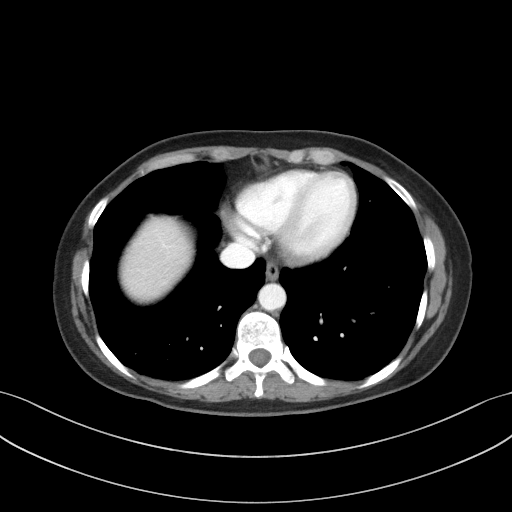
[im 47/58  bone]
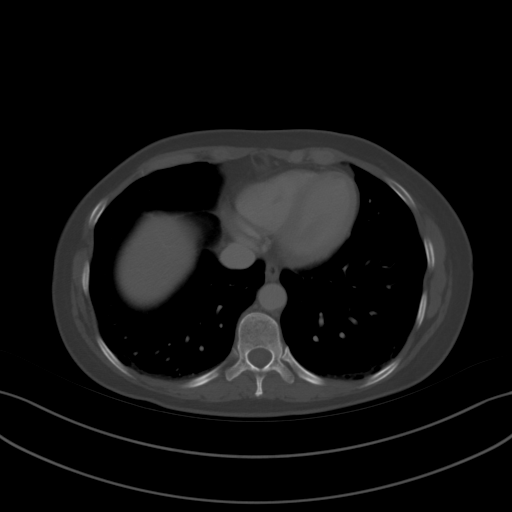
[im 52/58  soft-tissue]
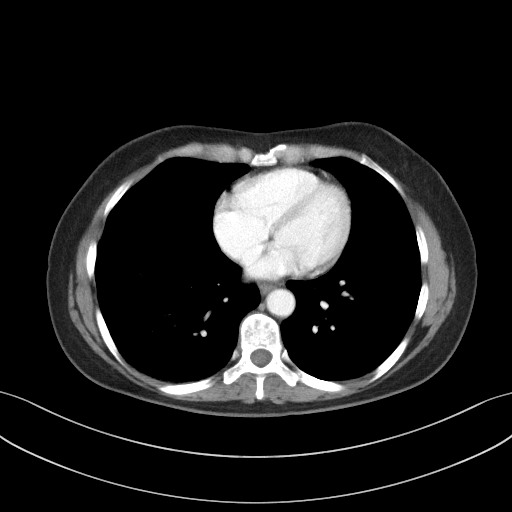

[Series 4: coronal st · coronal · 0.59mm/px · 3 of 77 slices shown]
[im 26/77  soft-tissue]
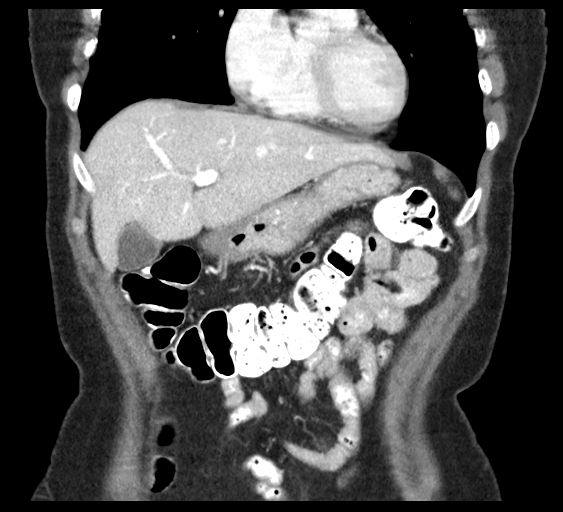
[im 34/77  soft-tissue]
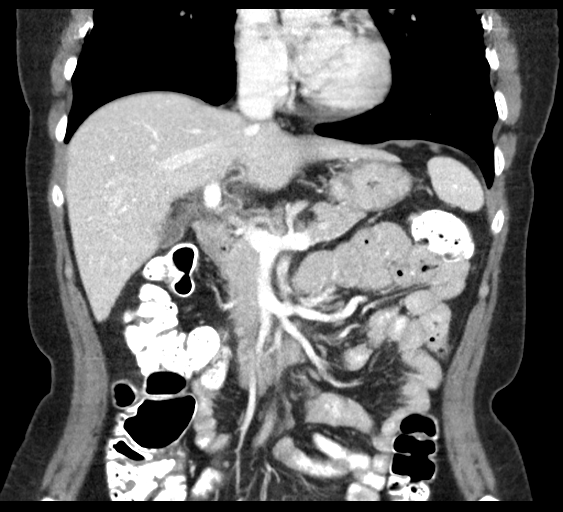
[im 43/77  soft-tissue]
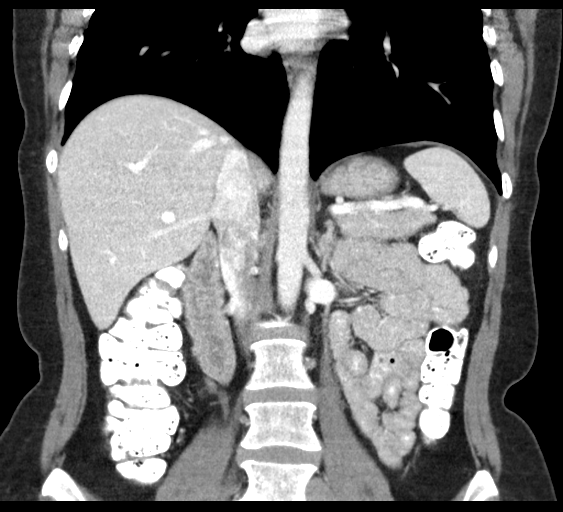

[Series 6: lung bases · axial · 0.75mm/px · z∈[-132,-82]mm · 4 of 71 slices shown]
[im 6/71  bone]
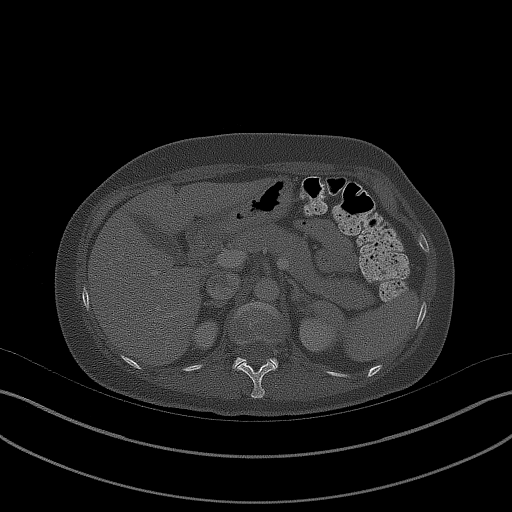
[im 16/71  bone]
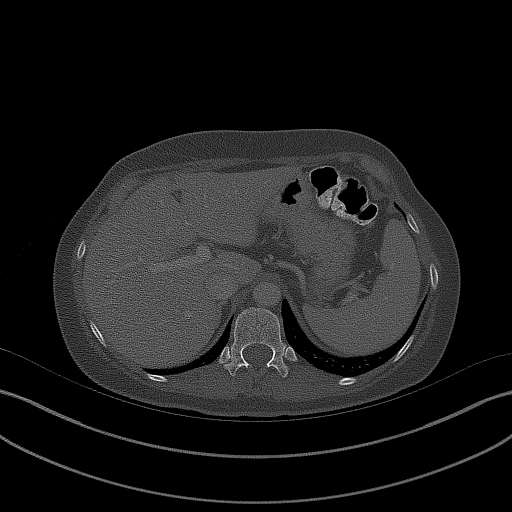
[im 26/71  bone]
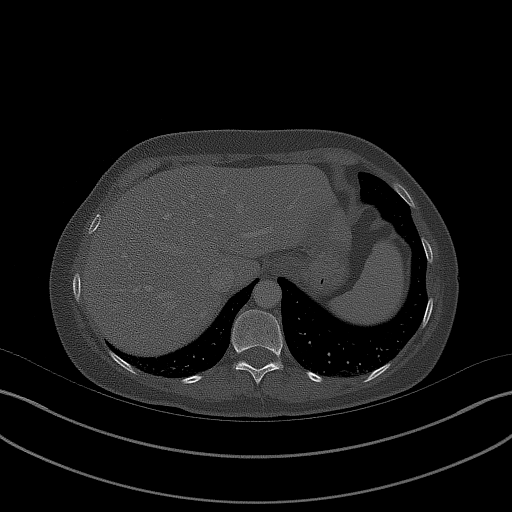
[im 31/71  bone]
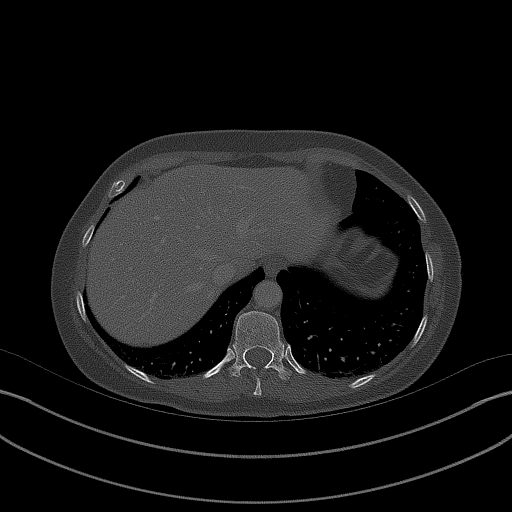

[17 of 46 positions shown; findings below may reference images not displayed]

FINDINGS: Lower chest: Dependent subsegmental atelectasis in the posterior
basal segments of both lower lobes.

Hepatobiliary: 4 by 3 mm hypodense lesion in the liver on image 31
series 4, likely a cyst but technically too small to characterize.
Gallbladder unremarkable. No biliary dilatation.

Pancreas: Unremarkable

Spleen: Unremarkable

Adrenals/Urinary Tract: Unremarkable

Stomach/Bowel: Unremarkable

Vascular/Lymphatic: Unremarkable

Other: No supplemental non-categorized findings.

Musculoskeletal: Unremarkable
IMPRESSION: 1. A cause for the patient's epigastric pain is not identified.
2. Dependent subsegmental atelectasis in the posterior basal
segments of both lower lobes.

## 2021-05-17 ENCOUNTER — Encounter: Payer: Self-pay | Admitting: Obstetrics and Gynecology

## 2021-05-17 ENCOUNTER — Ambulatory Visit (INDEPENDENT_AMBULATORY_CARE_PROVIDER_SITE_OTHER): Payer: BC Managed Care – PPO | Admitting: Obstetrics and Gynecology

## 2021-05-17 ENCOUNTER — Other Ambulatory Visit: Payer: Self-pay

## 2021-05-17 VITALS — BP 138/80 | HR 65 | Ht 60.0 in | Wt 120.0 lb

## 2021-05-17 DIAGNOSIS — Z01419 Encounter for gynecological examination (general) (routine) without abnormal findings: Secondary | ICD-10-CM

## 2021-05-17 NOTE — Patient Instructions (Signed)

## 2021-05-17 NOTE — Progress Notes (Signed)
50 y.o. G11P4014 Married Hispanic female here for annual exam.    Sometimes has hot flashes.  Does ok with them.  Has 1 - 3 per week.   Last Prolia injection was in March 22, 2021.   Expecting her 4th grandchild.  Works for a [employer].   PCP:  Barry Brunner, PA-C  Patient's last menstrual period was 02/09/2005.           Sexually active: Yes.    The current method of family planning is status post hysterectomy.    Exercising: Yes.    Does a lot with work--packing, walking Smoker:  no  Health Maintenance: Pap: 5-10-17Neg, 10-18-12 Neg:Neg HR HPV, 08-30-11 Neg History of abnormal Pap:  no MMG: 07-27-20 3D/Neg/BiRads1 Colonoscopy:  02-23-21 Normal;next 10 years BMD:  04-21-20  Result :Osteoporosis--Prolia TDaP: 12-17-13 Gardasil:   no HIV: no Hep C:no Screening Labs:  PCP.   reports that she has never smoked. She has never used smokeless tobacco. She reports that she does not drink alcohol and does not use drugs.  Past Medical History:  Diagnosis Date   Chronic ITP (idiopathic thrombocytopenia) (HCC)    Diabetes mellitus without complication (HCC)    Iron deficiency anemia    Migraine    Normal vaginal delivery    3 vag deliveries   Vitamin D deficiency     Past Surgical History:  Procedure Laterality Date   CESAREAN SECTION  2000   TOTAL ABDOMINAL HYSTERECTOMY  2006   atypical leiomyoma   UPPER GASTROINTESTINAL ENDOSCOPY  2014    Current Outpatient Medications  Medication Sig Dispense Refill   Ascorbic Acid (VITAMIN C) 1000 MG tablet Take 1,000 mg by mouth daily.     calcium carbonate (OS-CAL) 600 MG TABS Take 600 mg by mouth 2 (two) times daily with a meal.     cholecalciferol (VITAMIN D) 1000 UNITS tablet Take 1,000 Units by mouth daily.     denosumab (PROLIA) 60 MG/ML SOSY injection Inject 60 mg into the skin every 6 (six) months.     ibuprofen (ADVIL) 600 MG tablet Take 1 tablet (600 mg total) by mouth every 6 (six) hours as needed. 30 tablet 0   IRON  PO Take 1 tablet by mouth daily.     MAGNESIUM PO Take by mouth.     Omega-3 1000 MG CAPS Take by mouth.     pantoprazole (PROTONIX) 40 MG tablet Take 40 mg by mouth daily.     Probiotic Product (PROBIOTIC-10 PO) Take by mouth.     No current facility-administered medications for this visit.    Family History  Problem Relation Age of Onset   Hypertension Sister    Colon cancer Neg Hx    Esophageal cancer Neg Hx    Pancreatic cancer Neg Hx    Stomach cancer Neg Hx     Review of Systems  All other systems reviewed and are negative.  Exam:   BP 138/80   Pulse 65   Ht 5' (1.524 m)   Wt 120 lb (54.4 kg)   LMP 02/09/2005   SpO2 100%   BMI 23.44 kg/m     General appearance: alert, cooperative and appears stated age Head: normocephalic, without obvious abnormality, atraumatic Neck: no adenopathy, supple, symmetrical, trachea midline and thyroid normal to inspection and palpation Lungs: clear to auscultation bilaterally Breasts: normal appearance, no masses or tenderness, No nipple retraction or dimpling, No nipple discharge or bleeding, No axillary adenopathy Heart: regular rate and rhythm  Abdomen: soft, non-tender; no masses, no organomegaly Extremities: extremities normal, atraumatic, no cyanosis or edema Skin: skin color, texture, turgor normal. No rashes or lesions Lymph nodes: cervical, supraclavicular, and axillary nodes normal. Neurologic: grossly normal  Pelvic: External genitalia:  no lesions              No abnormal inguinal nodes palpated.              Urethra:  normal appearing urethra with no masses, tenderness or lesions              Bartholins and Skenes: normal                 Vagina: normal appearing vagina with normal color and discharge, no lesions              Cervix: absent              Pap taken: no Bimanual Exam:  Uterus:  absent              Adnexa: no mass, fullness, tenderness              Rectal exam: yes.  Confirms.              Anus:  normal  sphincter tone, no lesions  Chaperone was present for exam:  Marchelle Folks, CMA  Assessment:   Well woman visit with gynecologic exam. Status post TAH for fibroids.  ITP. Osteoporosis.  On Prolia.  Vaginal atrophy.   Plan: Mammogram screening discussed. Self breast awareness reviewed. Pap and HR HPV as above. Guidelines for Calcium, Vitamin D, regular exercise program including cardiovascular and weight bearing exercise. Labs with PCP.  Prolia due in December, 2022.  Ky jelly and cooking oils for vaginal dryness.    Follow up annually and prn.   After visit summary provided.

## 2021-05-21 ENCOUNTER — Ambulatory Visit: Payer: Medicaid Other | Admitting: Obstetrics & Gynecology

## 2021-07-12 ENCOUNTER — Ambulatory Visit: Payer: Medicaid Other | Admitting: Obstetrics and Gynecology

## 2021-07-16 ENCOUNTER — Telehealth: Payer: Self-pay | Admitting: *Deleted

## 2021-07-16 DIAGNOSIS — M810 Age-related osteoporosis without current pathological fracture: Secondary | ICD-10-CM

## 2021-07-16 NOTE — Telephone Encounter (Signed)
Prolia insurance verification has been sent awaiting Summary of benefits  

## 2021-07-16 NOTE — Telephone Encounter (Signed)
PROLIA GIVEN 03/22/2021 NEXT INJECTION 09/22/2021

## 2021-07-20 ENCOUNTER — Encounter: Payer: Self-pay | Admitting: *Deleted

## 2021-07-20 NOTE — Telephone Encounter (Addendum)
Deductible $2500 Met of $2500 Required  OOP MAX Individual: $4500 ($3324.11 Met) Family: $9000 ($3324.11 Met)  Annual exam 05/17/2021  Calcium  9.7           Date 07/27/2021  Upcoming dental procedures   Prior Authorization needed NO  Pt estimated Cost $401  APPT 09/22/2021   Pt can use Prolia card to pay for estimated cost,to revamp Prolia card call [phone]     Coverage Details:20% one dose, 20% admin fee

## 2021-07-26 ENCOUNTER — Other Ambulatory Visit: Payer: Self-pay

## 2021-07-26 ENCOUNTER — Other Ambulatory Visit: Payer: BC Managed Care – PPO

## 2021-07-26 DIAGNOSIS — M810 Age-related osteoporosis without current pathological fracture: Secondary | ICD-10-CM

## 2021-07-27 LAB — CALCIUM: Calcium: 9.7 mg/dL (ref 8.6–10.4)

## 2021-08-05 ENCOUNTER — Encounter: Payer: Self-pay | Admitting: Nurse Practitioner

## 2021-09-22 ENCOUNTER — Other Ambulatory Visit: Payer: Self-pay

## 2021-09-22 ENCOUNTER — Ambulatory Visit (INDEPENDENT_AMBULATORY_CARE_PROVIDER_SITE_OTHER): Payer: BC Managed Care – PPO | Admitting: *Deleted

## 2021-09-22 DIAGNOSIS — M810 Age-related osteoporosis without current pathological fracture: Secondary | ICD-10-CM | POA: Diagnosis not present

## 2021-09-22 MED ORDER — DENOSUMAB 60 MG/ML ~~LOC~~ SOSY
60.0000 mg | PREFILLED_SYRINGE | Freq: Once | SUBCUTANEOUS | Status: AC
Start: 2021-09-22 — End: 2021-09-22
  Administered 2021-09-22: 08:00:00 60 mg via SUBCUTANEOUS

## 2021-09-23 IMAGING — CR DG SHOULDER 2+V*R*
3 series · 3 of 3 positions shown · non-contrast
Comparison: None.

CLINICAL DATA: Right shoulder pain.

EXAM:
RIGHT SHOULDER - 2+ VIEW

[shoulder grashey]
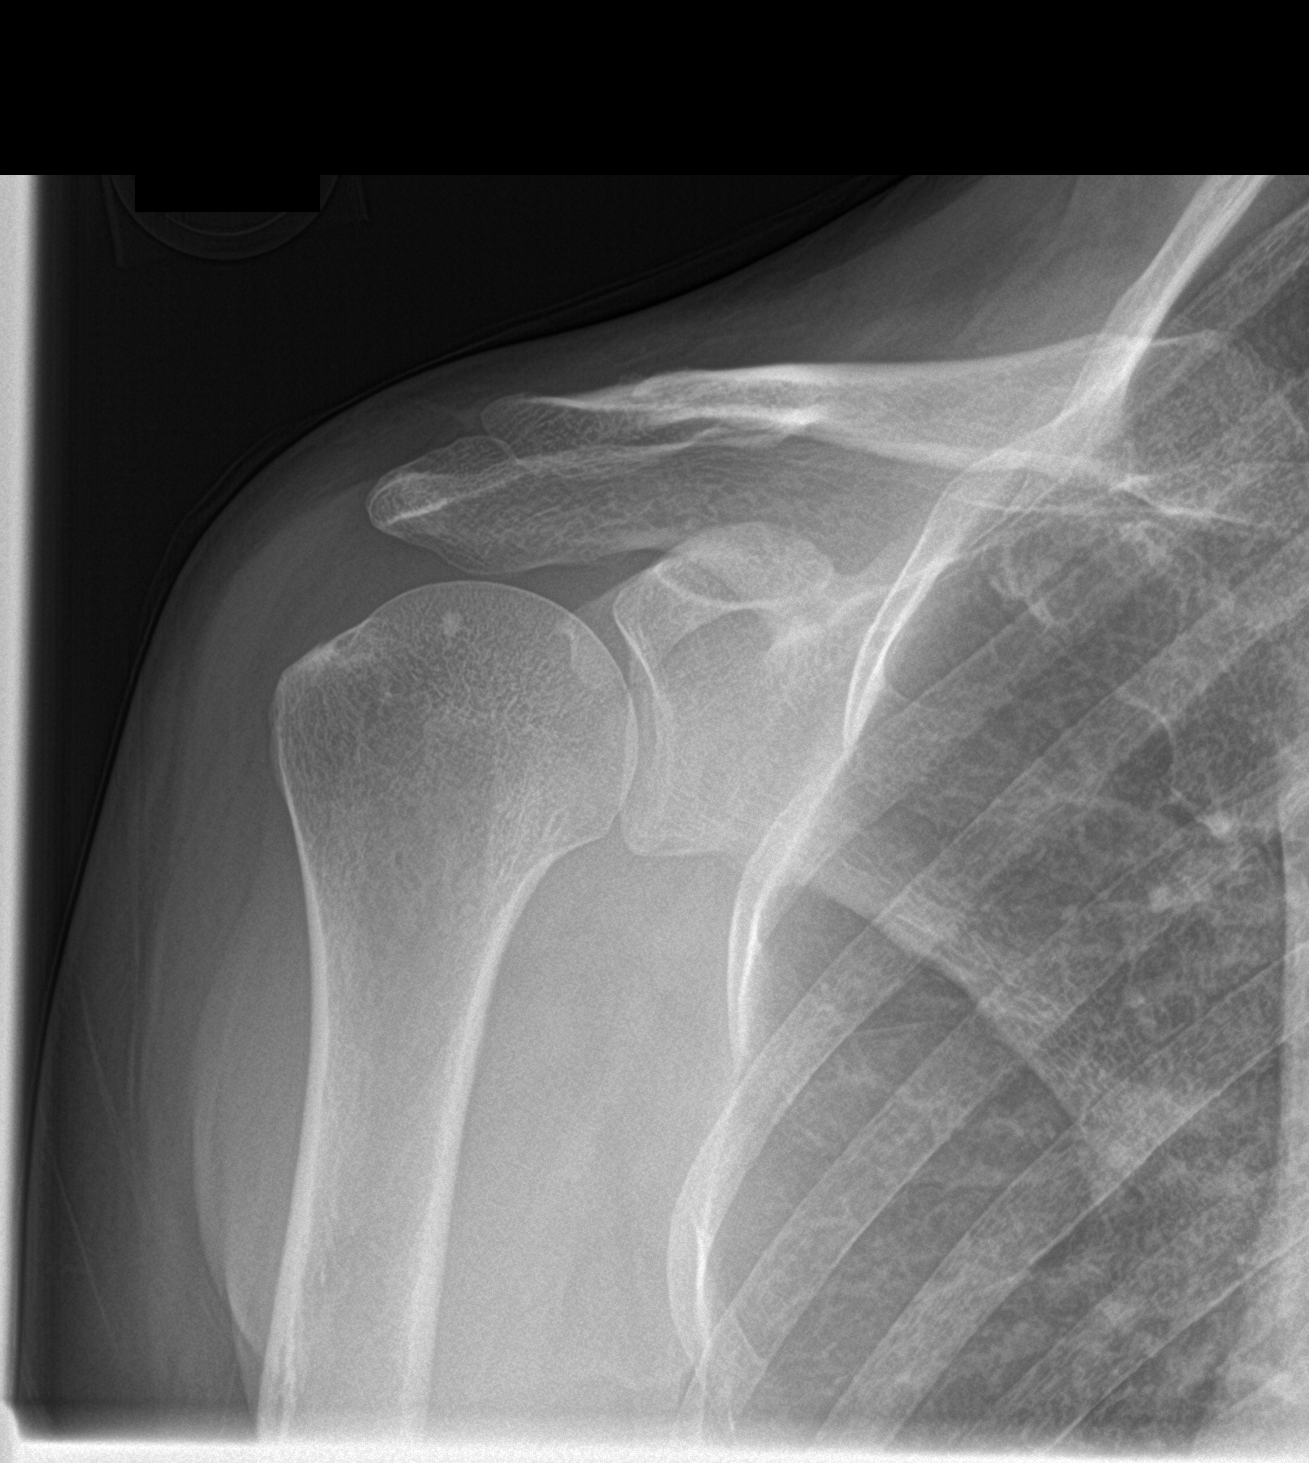

[shoulder y view]
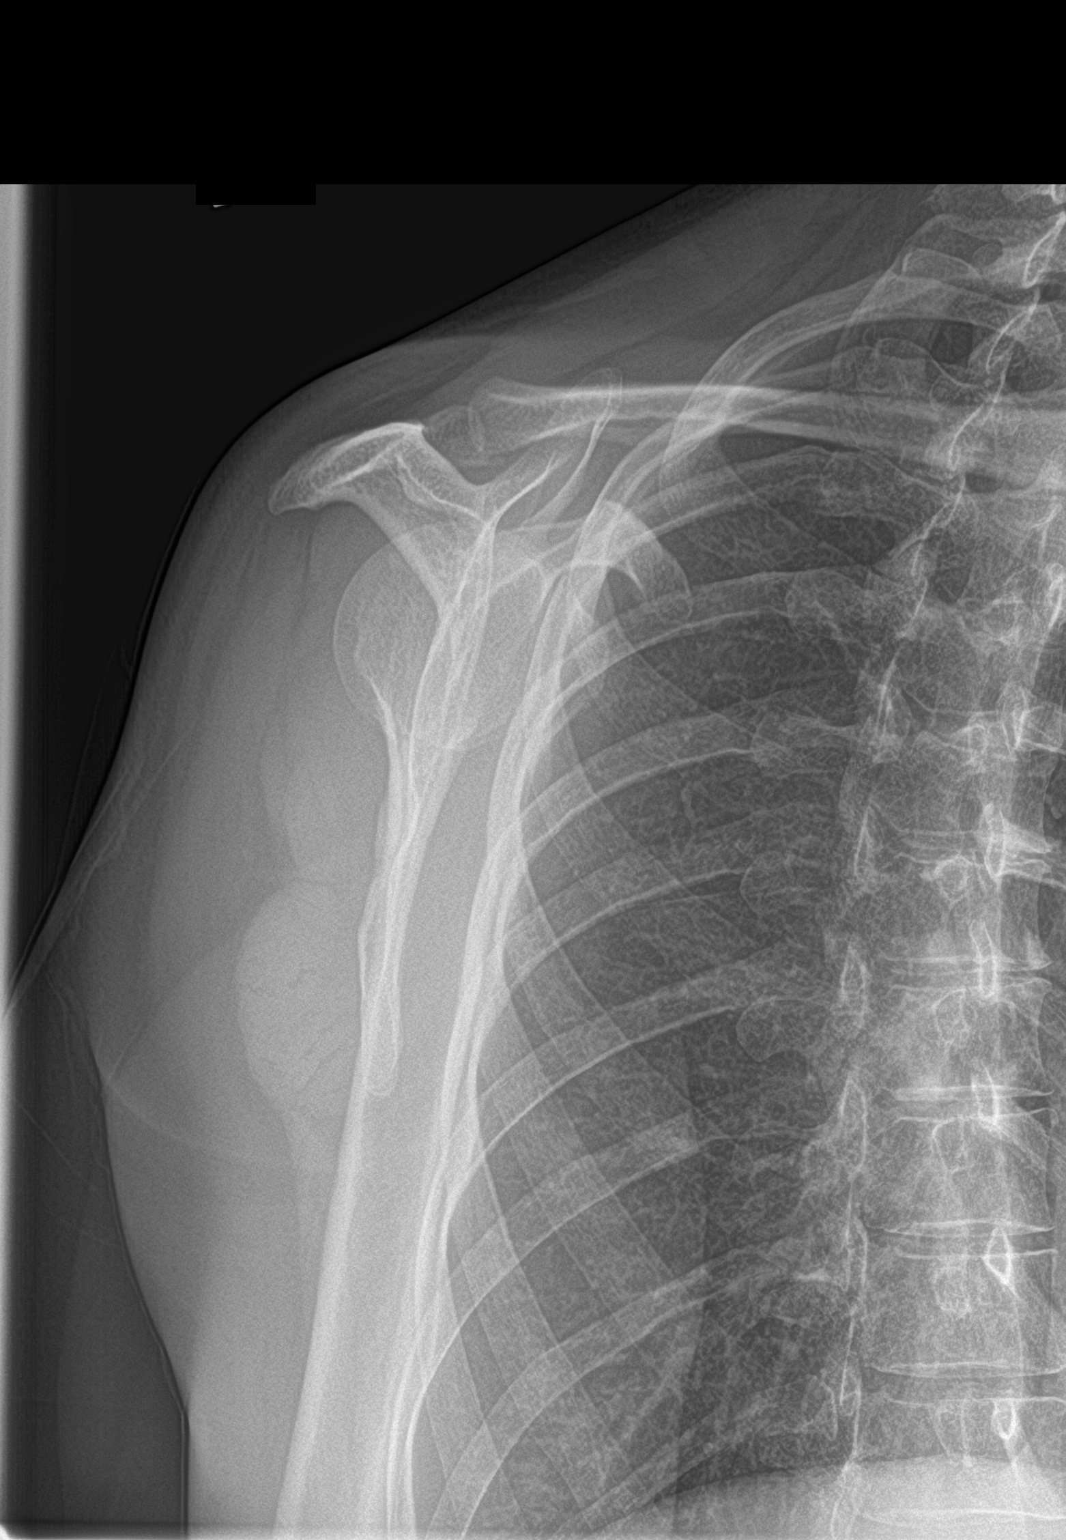

[shoulder ap neutral]
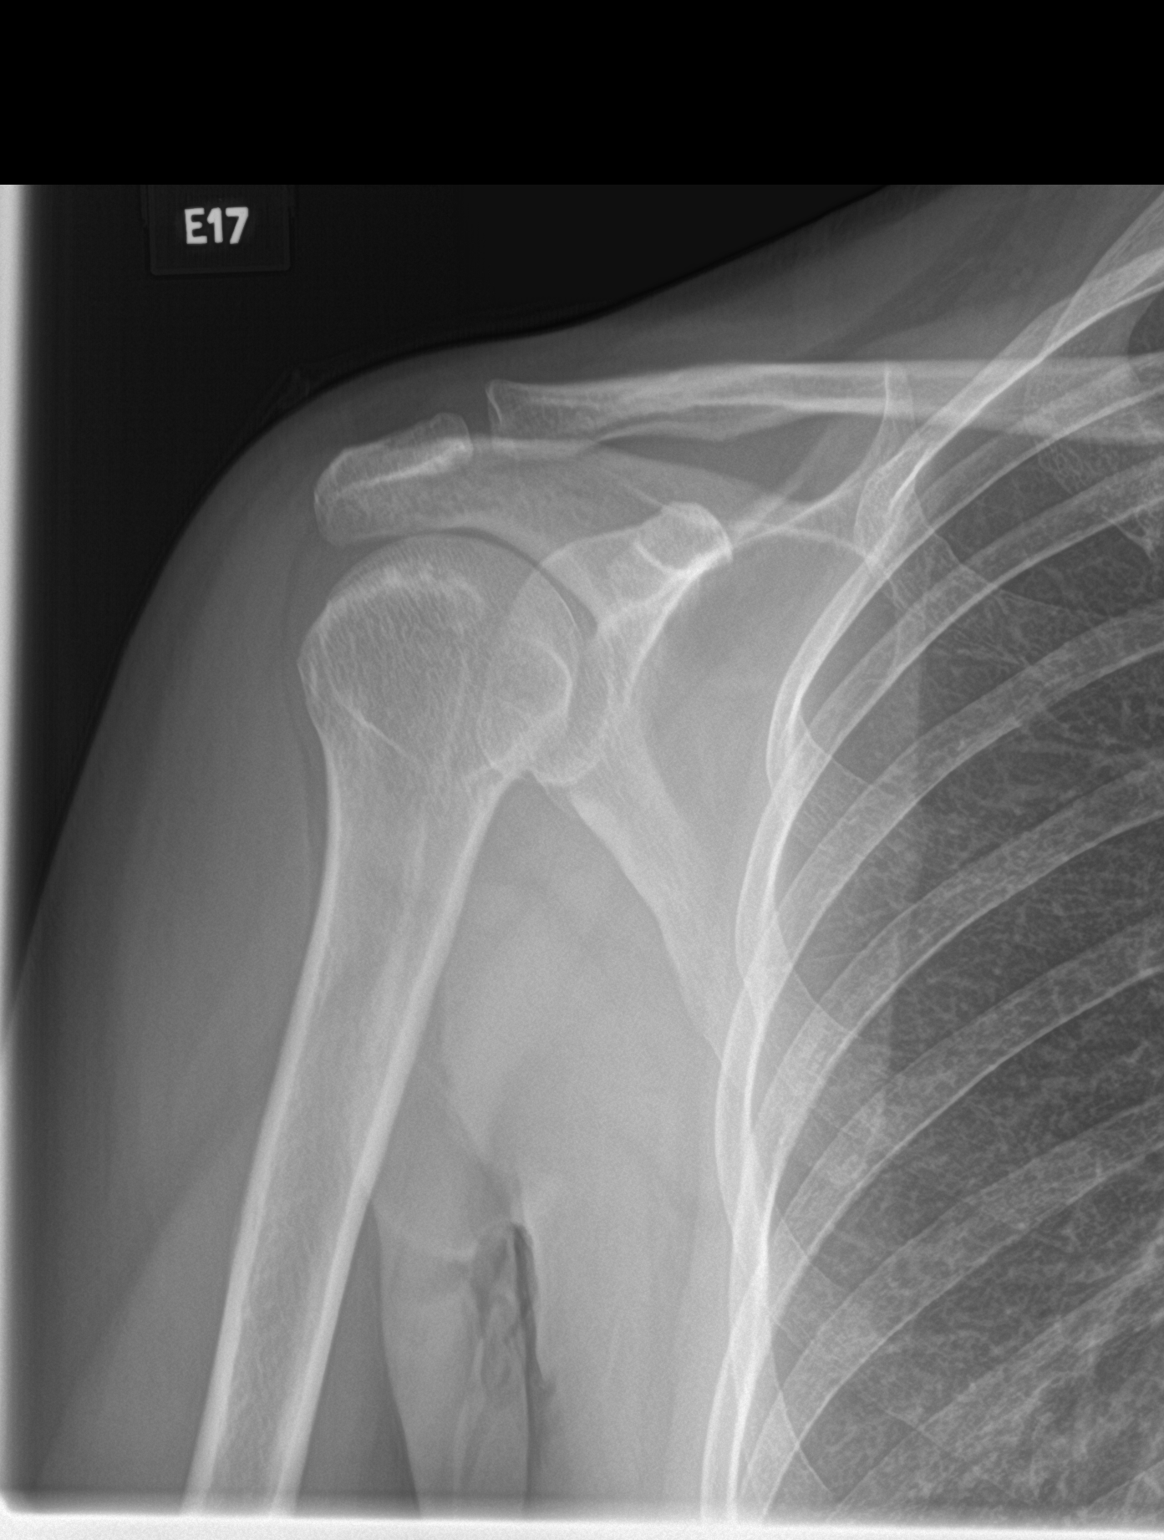

[3 of 3 positions shown; findings below may reference images not displayed]

FINDINGS: There is no evidence of fracture or dislocation. There is no
evidence of arthropathy or other focal bone abnormality. Soft
tissues are unremarkable.
IMPRESSION: Negative.

## 2021-09-30 NOTE — Telephone Encounter (Signed)
Patient received Prolia injection on 09-22-21. Prolia summary of benefits scanned into Epic.   Encounter closed.

## 2022-03-30 ENCOUNTER — Telehealth: Payer: Self-pay | Admitting: *Deleted

## 2022-03-30 NOTE — Telephone Encounter (Addendum)
Deductible $2500 ($20met)  OOP MAX $4500 ($48.19)  Annual exam 05/17/2021  Calcium  9.7           Date 07/26/2021   Upcoming dental procedures NO  Prior Authorization needed NO  Pt estimated Cost $2812 pt will use Prolia card    Appt 04/06/2022      Coverage Details:20% one dose, 20% adminfee

## 2022-04-06 ENCOUNTER — Ambulatory Visit (INDEPENDENT_AMBULATORY_CARE_PROVIDER_SITE_OTHER): Payer: BC Managed Care – PPO

## 2022-04-06 VITALS — BP 100/66 | HR 78

## 2022-04-06 DIAGNOSIS — M810 Age-related osteoporosis without current pathological fracture: Secondary | ICD-10-CM | POA: Diagnosis not present

## 2022-04-06 MED ORDER — DENOSUMAB 60 MG/ML ~~LOC~~ SOSY
60.0000 mg | PREFILLED_SYRINGE | Freq: Once | SUBCUTANEOUS | Status: AC
  Administered 2022-04-06: 60 mg via SUBCUTANEOUS

## 2022-04-06 NOTE — Progress Notes (Signed)
Patient in today for  Prolia injection. Patient's initial calcium level was obtained on 9.7.  Result: 07-26-21.  Last AEX: 05-17-21 Last BMD: 04-21-20  Injection given in left arm.  Patient tolerated injection well.  Routed to provider for review.

## 2022-04-26 HISTORY — PX: VEIN LIGATION: SHX2652

## 2022-05-17 NOTE — Progress Notes (Unsigned)
51 y.o. [id number] Married Hispanic female here for annual exam.    PCP:  Barry Brunner, PA-C   Patient's last menstrual period was 02/09/2005.           Sexually active: {yes no:314532}  The current method of family planning is status post hysterectomy.    Exercising: {yes no:314532}  {types:19826} Smoker:  no  Health Maintenance: Pap:  5-10-17Neg, 10-18-12 Neg:Neg HR HPV, 08-30-11 Neg History of abnormal Pap:  {YES NO:22349} MMG:  08-02-21 Neg/BiRads1 Colonoscopy:  *** BMD: 04-21-20  Result : Osteoporosis TDaP:  12-17-13 Gardasil:   no HIV:no Hep C:no Screening Labs:  Hb today: ***, Urine today: ***   reports that she has never smoked. She has never used smokeless tobacco. She reports that she does not drink alcohol and does not use drugs.  Past Medical History:  Diagnosis Date   Chronic ITP (idiopathic thrombocytopenia) (HCC)    Diabetes mellitus without complication (HCC)    Iron deficiency anemia    Migraine    Normal vaginal delivery    3 vag deliveries   Vitamin D deficiency     Past Surgical History:  Procedure Laterality Date   CESAREAN SECTION  2000   TOTAL ABDOMINAL HYSTERECTOMY  2006   atypical leiomyoma   UPPER GASTROINTESTINAL ENDOSCOPY  2014    Current Outpatient Medications  Medication Sig Dispense Refill   Ascorbic Acid (VITAMIN C) 1000 MG tablet Take 1,000 mg by mouth daily.     calcium carbonate (OS-CAL) 600 MG TABS Take 600 mg by mouth 2 (two) times daily with a meal.     cholecalciferol (VITAMIN D) 1000 UNITS tablet Take 1,000 Units by mouth daily.     denosumab (PROLIA) 60 MG/ML SOSY injection Inject 60 mg into the skin every 6 (six) months.     ibuprofen (ADVIL) 600 MG tablet Take 1 tablet (600 mg total) by mouth every 6 (six) hours as needed. 30 tablet 0   IRON PO Take 1 tablet by mouth daily.     MAGNESIUM PO Take by mouth.     Omega-3 1000 MG CAPS Take by mouth.     pantoprazole (PROTONIX) 40 MG tablet Take 40 mg by mouth daily.     Probiotic  Product (PROBIOTIC-10 PO) Take by mouth.     No current facility-administered medications for this visit.    Family History  Problem Relation Age of Onset   Hypertension Sister    Colon cancer Neg Hx    Esophageal cancer Neg Hx    Pancreatic cancer Neg Hx    Stomach cancer Neg Hx     Review of Systems  Exam:   LMP 02/09/2005     General appearance: alert, cooperative and appears stated age Head: normocephalic, without obvious abnormality, atraumatic Neck: no adenopathy, supple, symmetrical, trachea midline and thyroid normal to inspection and palpation Lungs: clear to auscultation bilaterally Breasts: normal appearance, no masses or tenderness, No nipple retraction or dimpling, No nipple discharge or bleeding, No axillary adenopathy Heart: regular rate and rhythm Abdomen: soft, non-tender; no masses, no organomegaly Extremities: extremities normal, atraumatic, no cyanosis or edema Skin: skin color, texture, turgor normal. No rashes or lesions Lymph nodes: cervical, supraclavicular, and axillary nodes normal. Neurologic: grossly normal  Pelvic: External genitalia:  no lesions              No abnormal inguinal nodes palpated.              Urethra:  normal appearing urethra with  no masses, tenderness or lesions              Bartholins and Skenes: normal                 Vagina: normal appearing vagina with normal color and discharge, no lesions              Cervix: no lesions              Pap taken: {yes no:314532} Bimanual Exam:  Uterus:  normal size, contour, position, consistency, mobility, non-tender              Adnexa: no mass, fullness, tenderness              Rectal exam: {yes no:314532}.  Confirms.              Anus:  normal sphincter tone, no lesions  Chaperone was present for exam:  ***  Assessment:   Well woman visit with gynecologic exam.   Plan: Mammogram screening discussed. Self breast awareness reviewed. Pap and HR HPV as above. Guidelines for Calcium,  Vitamin D, regular exercise program including cardiovascular and weight bearing exercise.   Follow up annually and prn.   Additional counseling given.  {yes [id number]. _______ minutes face to face time of which over 50% was spent in counseling.    After visit summary provided.

## 2022-05-19 ENCOUNTER — Ambulatory Visit (INDEPENDENT_AMBULATORY_CARE_PROVIDER_SITE_OTHER): Payer: BC Managed Care – PPO | Admitting: Obstetrics and Gynecology

## 2022-05-19 ENCOUNTER — Encounter: Payer: Self-pay | Admitting: Obstetrics and Gynecology

## 2022-05-19 VITALS — BP 110/72 | HR 86 | Ht 65.0 in | Wt 117.0 lb

## 2022-05-19 DIAGNOSIS — R5383 Other fatigue: Secondary | ICD-10-CM

## 2022-05-19 DIAGNOSIS — M810 Age-related osteoporosis without current pathological fracture: Secondary | ICD-10-CM | POA: Diagnosis not present

## 2022-05-19 DIAGNOSIS — Z01419 Encounter for gynecological examination (general) (routine) without abnormal findings: Secondary | ICD-10-CM | POA: Diagnosis not present

## 2022-05-19 NOTE — Patient Instructions (Signed)

## 2022-05-20 ENCOUNTER — Other Ambulatory Visit: Payer: Self-pay

## 2022-05-20 DIAGNOSIS — M810 Age-related osteoporosis without current pathological fracture: Secondary | ICD-10-CM

## 2022-05-20 LAB — COMPREHENSIVE METABOLIC PANEL
AG Ratio: 1.5 (calc) (ref 1.0–2.5)
ALT: 21 U/L (ref 6–29)
AST: 27 U/L (ref 10–35)
Albumin: 4.4 g/dL (ref 3.6–5.1)
Alkaline phosphatase (APISO): 50 U/L (ref 37–153)
BUN: 10 mg/dL (ref 7–25)
CO2: 25 mmol/L (ref 20–32)
Calcium: 9.2 mg/dL (ref 8.6–10.4)
Chloride: 107 mmol/L (ref 98–110)
Creat: 0.73 mg/dL (ref 0.50–1.03)
Globulin: 2.9 g/dL (calc) (ref 1.9–3.7)
Glucose, Bld: 93 mg/dL (ref 65–99)
Potassium: 4.4 mmol/L (ref 3.5–5.3)
Sodium: 143 mmol/L (ref 135–146)
Total Bilirubin: 0.6 mg/dL (ref 0.2–1.2)
Total Protein: 7.3 g/dL (ref 6.1–8.1)

## 2022-05-20 LAB — CBC
HCT: 44.5 % (ref 35.0–45.0)
Hemoglobin: 14.7 g/dL (ref 11.7–15.5)
MCH: 30.9 pg (ref 27.0–33.0)
MCHC: 33 g/dL (ref 32.0–36.0)
MCV: 93.7 fL (ref 80.0–100.0)
MPV: 11.4 fL (ref 7.5–12.5)
Platelets: 166 10*3/uL (ref 140–400)
RBC: 4.75 10*6/uL (ref 3.80–5.10)
RDW: 13.1 % (ref 11.0–15.0)
WBC: 4.5 10*3/uL (ref 3.8–10.8)

## 2022-05-20 LAB — VITAMIN D 25 HYDROXY (VIT D DEFICIENCY, FRACTURES): Vit D, 25-Hydroxy: 56 ng/mL (ref 30–100)

## 2022-05-20 LAB — FERRITIN: Ferritin: 147 ng/mL (ref 16–232)

## 2022-05-20 LAB — VITAMIN B12: Vitamin B-12: 1223 pg/mL — ABNORMAL HIGH (ref 200–1100)

## 2022-05-20 LAB — TSH: TSH: 2.13 mIU/L

## 2022-05-20 LAB — IRON: Iron: 74 ug/dL (ref 45–160)

## 2022-06-15 ENCOUNTER — Ambulatory Visit (INDEPENDENT_AMBULATORY_CARE_PROVIDER_SITE_OTHER): Payer: BC Managed Care – PPO

## 2022-06-15 ENCOUNTER — Other Ambulatory Visit: Payer: Self-pay | Admitting: Obstetrics and Gynecology

## 2022-06-15 DIAGNOSIS — Z13820 Encounter for screening for osteoporosis: Secondary | ICD-10-CM | POA: Diagnosis not present

## 2022-06-15 DIAGNOSIS — M810 Age-related osteoporosis without current pathological fracture: Secondary | ICD-10-CM

## 2022-06-15 DIAGNOSIS — Z780 Asymptomatic menopausal state: Secondary | ICD-10-CM

## 2022-06-15 DIAGNOSIS — M8589 Other specified disorders of bone density and structure, multiple sites: Secondary | ICD-10-CM

## 2022-06-26 HISTORY — PX: CHOLECYSTECTOMY: SHX55

## 2022-08-10 ENCOUNTER — Encounter: Payer: Self-pay | Admitting: Nurse Practitioner

## 2022-10-07 ENCOUNTER — Telehealth: Payer: Self-pay | Admitting: *Deleted

## 2022-10-07 NOTE — Telephone Encounter (Signed)
Prolia insurance verification has been sent awaiting Summary of benefits  

## 2022-10-13 NOTE — Telephone Encounter (Addendum)
Deductible $2500 ($0 met)  OOP MAX $4500 ($0 met)  Annual exam 05/19/2022   Calcium  9.2        Date 05/19/2022  Upcoming dental procedures No  Is Prior Authorization needed No  Pt estimated Cost $2100 pt has not met ded Patient may use Prolia card   Appt 10/17/2022    Coverage Details:20% one dose, 20% admin fee

## 2022-10-17 ENCOUNTER — Ambulatory Visit (INDEPENDENT_AMBULATORY_CARE_PROVIDER_SITE_OTHER): Payer: BC Managed Care – PPO | Admitting: *Deleted

## 2022-10-17 DIAGNOSIS — M810 Age-related osteoporosis without current pathological fracture: Secondary | ICD-10-CM

## 2022-10-17 MED ORDER — DENOSUMAB 60 MG/ML ~~LOC~~ SOSY
60.0000 mg | PREFILLED_SYRINGE | Freq: Once | SUBCUTANEOUS | Status: AC
  Administered 2022-10-17: 60 mg via SUBCUTANEOUS

## 2023-03-22 ENCOUNTER — Telehealth: Payer: Self-pay | Admitting: *Deleted

## 2023-03-22 NOTE — Telephone Encounter (Signed)
Next Prolia due on or after 04/18/23.   Submitted to [employer] for review of benefits.

## 2023-03-27 MED ORDER — DENOSUMAB 60 MG/ML ~~LOC~~ SOSY
60.0000 mg | PREFILLED_SYRINGE | Freq: Once | SUBCUTANEOUS | Status: AC
  Administered 2023-04-18: 60 mg via SUBCUTANEOUS

## 2023-03-27 NOTE — Telephone Encounter (Signed)
Deductible:  $2500 ($141.36 met)  OOP MAX: 20% coinsurance, $4500 OOP max ($476.01 met)  Annual exam:05/19/22 -BS  Calcium:  9.2          Date: 05/19/22  Upcoming dental procedures: No   Hx of Kidney Disease: No   Last Bone Density Scan: 06/15/22  Is Prior Authorization needed: no  Pt estimated Cost: Patients estimated OOP $1495.20. Will use Prolia Co-pay card.   Spoke with patient. Advised as seen above. Patient request to proceed. Nurse visit scheduled for 04/18/23 at 3:45pm. Patient verbalizes understanding and is agreeable.   Routing to provider for final review. Patient is agreeable to disposition. Will close encounter.  Cc: Lupita Leash

## 2023-04-18 ENCOUNTER — Ambulatory Visit: Payer: BC Managed Care – PPO | Admitting: *Deleted

## 2023-04-18 DIAGNOSIS — M810 Age-related osteoporosis without current pathological fracture: Secondary | ICD-10-CM | POA: Diagnosis not present

## 2023-06-13 NOTE — Progress Notes (Signed)
52 y.o. G46P4014 Married Hispanic female here for annual exam.    Some hot flashes at night.  Doing ok with this and not seeking tx.   Sex can be painful.  Went to [place] to see her family this year.   PCP:  Barry Brunner, PA-C    Patient's last menstrual period was 02/09/2005.           Sexually active: Yes.    The current method of family planning is status post hysterectomy.    Exercising: Yes.     walking Smoker:  no  Health Maintenance: Pap:  02/03/16 neg, 10/18/12 neg History of abnormal Pap:  no MMG:  08/08/22 Breast Density Cat C, BI-RADS CAT 1 neg Colonoscopy:  02/23/21 - due in 10 years BMD:   06/15/22  Result  osteopenia TDaP:  12/13/21 Gardasil:   no HIV: no Hep C: no Screening Labs:  PCP   reports that she has never smoked. She has never used smokeless tobacco. She reports that she does not drink alcohol and does not use drugs.  Past Medical History:  Diagnosis Date   Chronic ITP (idiopathic thrombocytopenia) (HCC)    Diabetes mellitus without complication (HCC)    GERD (gastroesophageal reflux disease)    Iron deficiency anemia    Migraine    Normal vaginal delivery    3 vag deliveries   Vitamin D deficiency     Past Surgical History:  Procedure Laterality Date   CESAREAN SECTION  2000   CHOLECYSTECTOMY  06/2022   TOTAL ABDOMINAL HYSTERECTOMY  2006   atypical leiomyoma   UPPER GASTROINTESTINAL ENDOSCOPY  2014   VEIN LIGATION  04/2022    Current Outpatient Medications  Medication Sig Dispense Refill   Ascorbic Acid (VITAMIN C) 1000 MG tablet Take 1,000 mg by mouth daily.     calcium carbonate (OS-CAL) 600 MG TABS Take 600 mg by mouth 2 (two) times daily with a meal.     cholecalciferol (VITAMIN D) 1000 UNITS tablet Take 1,000 Units by mouth daily.     denosumab (PROLIA) 60 MG/ML SOSY injection Inject 60 mg into the skin every 6 (six) months.     famotidine (PEPCID) 20 MG tablet Take 20 mg by mouth 2 (two) times daily as needed.     ibuprofen  (ADVIL) 600 MG tablet Take 1 tablet (600 mg total) by mouth every 6 (six) hours as needed. 30 tablet 0   IRON PO Take 1 tablet by mouth daily.     MAGNESIUM PO Take by mouth.     Omega-3 1000 MG CAPS Take by mouth.     ondansetron (ZOFRAN-ODT) 8 MG disintegrating tablet PLACE 1 TABLET ON TONGUE EVERY 8 HOURS AS NEEDED     Probiotic Product (PROBIOTIC-10 PO) Take by mouth.     No current facility-administered medications for this visit.    Family History  Problem Relation Age of Onset   Hypertension Sister    Colon cancer Neg Hx    Esophageal cancer Neg Hx    Pancreatic cancer Neg Hx    Stomach cancer Neg Hx     Review of Systems  All other systems reviewed and are negative.   Exam:   BP 124/68 (BP Location: Left Arm, Patient Position: Sitting, Cuff Size: Normal)   Pulse 61   Ht 5' 1.5" (1.562 m)   Wt 115 lb (52.2 kg)   LMP 02/09/2005   SpO2 99%   BMI 21.38 kg/m     General appearance:  alert, cooperative and appears stated age Head: normocephalic, without obvious abnormality, atraumatic Neck: no adenopathy, supple, symmetrical, trachea midline and thyroid normal to inspection and palpation Lungs: clear to auscultation bilaterally Breasts: normal appearance, no masses or tenderness, No nipple retraction or dimpling, No nipple discharge or bleeding, No axillary adenopathy Heart: regular rate and rhythm Abdomen: soft, non-tender; no masses, no organomegaly Extremities: extremities normal, atraumatic, no cyanosis or edema Skin: skin color, texture, turgor normal. No rashes or lesions Lymph nodes: cervical, supraclavicular, and axillary nodes normal. Neurologic: grossly normal  Pelvic: External genitalia:  no lesions              No abnormal inguinal nodes palpated.              Urethra:  normal appearing urethra with no masses, tenderness or lesions              Bartholins and Skenes: normal                 Vagina: normal appearing vagina with normal color and discharge, no  lesions              Cervix: absent              Pap taken: no Bimanual Exam:  Uterus:  absent              Adnexa: no mass, fullness, tenderness              Rectal exam: Yes.  Confirms.              Anus:  normal sphincter tone, no lesions  Chaperone was present for exam:  Warren Lacy, CMA  Assessment:   Well woman visit with gynecologic exam. Status post TAH/right ovarian biopsy, left salpingectomy for fibroids.  Atypical leiomyoma. 02/08/11. Menopausal symptoms.  Vaginal atrophy.  Osteoporosis.  On Prolia through PCP.  BMD done at this office. GERD. ITP.  Plan: Mammogram screening discussed. Self breast awareness reviewed. Pap and HR HPV not indicated. Guidelines for Calcium, Vitamin D, regular exercise program including cardiovascular and weight bearing exercise. Estrogen reviewed.  Rx for Vagifem.  Instructed in use.  I discussed potential effect on breast cancer.   Lubricants discussed.  BMD next year at this office.  Flu and Covid vaccines discussed.  Follow up annually and prn.

## 2023-06-27 ENCOUNTER — Ambulatory Visit (INDEPENDENT_AMBULATORY_CARE_PROVIDER_SITE_OTHER): Payer: BC Managed Care – PPO | Admitting: Obstetrics and Gynecology

## 2023-06-27 ENCOUNTER — Encounter: Payer: Self-pay | Admitting: Obstetrics and Gynecology

## 2023-06-27 VITALS — BP 124/68 | HR 61 | Ht 61.5 in | Wt 115.0 lb

## 2023-06-27 DIAGNOSIS — Z01419 Encounter for gynecological examination (general) (routine) without abnormal findings: Secondary | ICD-10-CM

## 2023-06-27 DIAGNOSIS — N952 Postmenopausal atrophic vaginitis: Secondary | ICD-10-CM

## 2023-06-27 MED ORDER — ESTRADIOL 10 MCG VA TABS: 1.0000 | ORAL_TABLET | VAGINAL | 3 refills | Status: AC

## 2023-06-27 NOTE — Patient Instructions (Addendum)
Consider Estroven over the counter herbal product to treat menopausal hot flashes.   Consider KY Jelly, Replens, liquid vitamin E, or Replens for the vagina.  Cooking oils can also be used for vaginal hydration.   EXERCISE AND DIET:  We recommended that you start or continue a regular exercise program for good health. Regular exercise means any activity that makes your heart beat faster and makes you sweat.  We recommend exercising at least 30 minutes per day at least 3 days a week, preferably 4 or 5.  We also recommend a diet low in fat and sugar.  Inactivity, poor dietary choices and obesity can cause diabetes, heart attack, stroke, and kidney damage, among others.    ALCOHOL AND SMOKING:  Women should limit their alcohol intake to no more than 7 drinks/beers/glasses of wine (combined, not each!) per week. Moderation of alcohol intake to this level decreases your risk of breast cancer and liver damage. And of course, no recreational drugs are part of a healthy lifestyle.  And absolutely no smoking or even second hand smoke. Most people know smoking can cause heart and lung diseases, but did you know it also contributes to weakening of your bones? Aging of your skin?  Yellowing of your teeth and nails?  CALCIUM AND VITAMIN D:  Adequate intake of calcium and Vitamin D are recommended.  The recommendations for exact amounts of these supplements seem to change often, but generally speaking 600 mg of calcium (either carbonate or citrate) and 800 units of Vitamin D per day seems prudent. Certain women may benefit from higher intake of Vitamin D.  If you are among these women, your doctor will have told you during your visit.    PAP SMEARS:  Pap smears, to check for cervical cancer or precancers,  have traditionally been done yearly, although recent scientific advances have shown that most women can have pap smears less often.  However, every woman still should have a physical exam from her gynecologist every  year. It will include a breast check, inspection of the vulva and vagina to check for abnormal growths or skin changes, a visual exam of the cervix, and then an exam to evaluate the size and shape of the uterus and ovaries.  And after 52 years of age, a rectal exam is indicated to check for rectal cancers. We will also provide age appropriate advice regarding health maintenance, like when you should have certain vaccines, screening for sexually transmitted diseases, bone density testing, colonoscopy, mammograms, etc.   MAMMOGRAMS:  All women over 52 years old should have a yearly mammogram. Many facilities now offer a "3D" mammogram, which may cost around $50 extra out of pocket. If possible,  we recommend you accept the option to have the 3D mammogram performed.  It both reduces the number of women who will be called back for extra views which then turn out to be normal, and it is better than the routine mammogram at detecting truly abnormal areas.    COLONOSCOPY:  Colonoscopy to screen for colon cancer is recommended for all women at age 65.  We know, you hate the idea of the prep.  We agree, BUT, having colon cancer and not knowing it is worse!!  Colon cancer so often starts as a polyp that can be seen and removed at colonscopy, which can quite literally save your life!  And if your first colonoscopy is normal and you have no family history of colon cancer, most women don't have to  have it again for 10 years.  Once every ten years, you can do something that may end up saving your life, right?  We will be happy to help you get it scheduled when you are ready.  Be sure to check your insurance coverage so you understand how much it will cost.  It may be covered as a preventative service at no cost, but you should check your particular policy.

## 2023-08-16 ENCOUNTER — Encounter: Payer: Self-pay | Admitting: Nurse Practitioner

## 2023-10-03 ENCOUNTER — Telehealth: Payer: Self-pay | Admitting: *Deleted

## 2023-10-03 DIAGNOSIS — M810 Age-related osteoporosis without current pathological fracture: Secondary | ICD-10-CM

## 2023-10-03 NOTE — Telephone Encounter (Signed)
Insurance information submitted to [employer] portal. Will await summary of benefits for prolia.    

## 2023-10-11 NOTE — Telephone Encounter (Incomplete)
Deductible:  $2500 ($0 met)   OOP MAX: $0  Annual exam: 06-27-23 BS  Calcium:    appointment scheduled 10-12-23        Date:  Upcoming dental procedures: {YES/NO:21197}  Hx of Kidney Disease: {YES/NO:21197}  Last Bone Density Scan: 06-15-22   Is Prior Authorization needed: no  Pt estimated Cost: $25 copay   Coverage Details: Prolia and administration will be subject to a $2500 deductible ($0 met) and 20% coinsurance up to $4500 OOP max ($0 met) Patient uses copay card.

## 2023-10-12 ENCOUNTER — Other Ambulatory Visit: Payer: BC Managed Care – PPO

## 2023-10-17 ENCOUNTER — Other Ambulatory Visit: Payer: BC Managed Care – PPO

## 2023-10-18 ENCOUNTER — Encounter: Payer: Self-pay | Admitting: Obstetrics and Gynecology

## 2023-10-18 LAB — CALCIUM: Calcium: 9.5 mg/dL (ref 8.6–10.4)

## 2023-10-24 NOTE — Telephone Encounter (Signed)
-----   Message from Melony Overly sent at 10/18/2023 12:02 PM EST ----- Morton Stall,   Your calcium level is normal.   I will forward this result to our department that coordinates the appointments for Prolia injections.   Please contact the office for any questions.   Have a good afternoon!  Conley Simmonds, MD

## 2023-11-08 NOTE — Telephone Encounter (Signed)
Message left to return call to [facility] at [phone].

## 2024-03-27 ENCOUNTER — Ambulatory Visit: Admitting: Physician Assistant

## 2024-04-01 ENCOUNTER — Encounter: Payer: Self-pay | Admitting: Physician Assistant

## 2024-04-01 ENCOUNTER — Ambulatory Visit (INDEPENDENT_AMBULATORY_CARE_PROVIDER_SITE_OTHER): Admitting: Physician Assistant

## 2024-04-01 DIAGNOSIS — M25511 Pain in right shoulder: Secondary | ICD-10-CM | POA: Diagnosis not present

## 2024-04-01 NOTE — Progress Notes (Signed)
 Office Visit Note   Patient: Jenna Montoya           Date of Birth: July 18, 1971           MRN: [id number] Visit Date: 04/01/2024              Requested by: Mason Haywood LABOR, PA-C [address] [place],  [place] [id number] PCP: Mason Haywood LABOR, PA-C   Assessment & Plan: Visit Diagnoses:  1. 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  She has had a 2-year history of pain in her right shoulder.  Last few months became worse.  She did get exercises from her physician to do every day daily or as she can and she said she is getting better.  It hurts when she lifts heavy things at work she has pain with range of motion she did have x-rays done at [facility] a couple months ago did not show some showing some mild degenerative changes but declined x-rays today.  She does take ibuprofen  600 mg as needed.  Talked about trying a shot she declined this today.  She could also try formal physical therapy but does feel like she is doing better and will continue to do exercises she has been given.  If she had continuing symptoms that did not help with any conservative treatment could consider an MRI  Follow-Up Instructions: Return if symptoms worsen or fail to improve.   Orders:  No orders of the defined types were placed in this encounter.  No orders of the defined types were placed in this encounter.     Procedures: No procedures performed   Clinical Data: No additional findings.   Subjective: No chief complaint on file.   HPI patient is a 53 year old woman who comes in today with a chief complaint of right shoulder pain.  Started about 2 years ago.  Has become worse in the last couple months.  She says it hurts every day especially when she lifts heavy things at work.  She has pain with range of motion.  Review of Systems  All other systems reviewed and are  negative.    Objective: Vital Signs: LMP 02/09/2005   Physical Exam Constitutional:      Appearance: Normal appearance.  Pulmonary:     Effort: Pulmonary effort is normal.  Skin:    General: Skin is warm and dry.  Neurological:     General: No focal deficit present.     Mental Status: She is alert and oriented to person, place, and time.  Psychiatric:        Mood and Affect: Mood normal.        Behavior: Behavior normal.     Ortho Exam Examination of her right shoulder she does have some tightness in her shoulder blade.  She has full forward elevation internal rotation behind her back reproduces some of her painful symptoms good strength with resisted abduction external/internal rotation good grip strength sensation is intact distally.  She does have a positive empty can test and a mildly positive speeds test.  No asymmetry between her shoulders Specialty Comments:  No specialty comments available.  Imaging: No results found.   PMFS History: Patient Active Problem List   Diagnosis Date Noted   Pain in right shoulder 04/01/2024   Gastroesophageal reflux disease 11/17/2020   LUQ abdominal pain 11/17/2020   History of total abdominal hysterectomy 04/08/2020  Postmenopausal 04/08/2020   Epigastric abdominal pain 10/18/2012   Chronic ITP (idiopathic thrombocytopenia) (HCC) 08/30/2011   Past Medical History:  Diagnosis Date   Chronic ITP (idiopathic thrombocytopenia) (HCC)    Diabetes mellitus without complication (HCC)    GERD (gastroesophageal reflux disease)    Iron deficiency anemia    Migraine    Normal vaginal delivery    3 vag deliveries   Vitamin D  deficiency     Family History  Problem Relation Age of Onset   Hypertension Sister    Colon cancer Neg Hx    Esophageal cancer Neg Hx    Pancreatic cancer Neg Hx    Stomach cancer Neg Hx     Past Surgical History:  Procedure Laterality Date   CESAREAN SECTION  2000   CHOLECYSTECTOMY  06/2022   TOTAL  ABDOMINAL HYSTERECTOMY  2006   atypical leiomyoma   UPPER GASTROINTESTINAL ENDOSCOPY  2014   VEIN LIGATION  04/2022   Social History   Occupational History   Not on file  Tobacco Use   Smoking status: Never   Smokeless tobacco: Never  Vaping Use   Vaping status: Never Used  Substance and Sexual Activity   Alcohol use: No    Alcohol/week: 0.0 standard drinks of alcohol   Drug use: No   Sexual activity: Yes    Birth control/protection: Surgical, None    Comment: hyst. 1st intercourse- 16, partners- 1

## 2024-06-03 ENCOUNTER — Emergency Department (HOSPITAL_BASED_OUTPATIENT_CLINIC_OR_DEPARTMENT_OTHER): Payer: Self-pay

## 2024-06-03 ENCOUNTER — Emergency Department (HOSPITAL_BASED_OUTPATIENT_CLINIC_OR_DEPARTMENT_OTHER)
Admission: EM | Admit: 2024-06-03 | Discharge: 2024-06-03 | Disposition: A | Discharge status: Home / Self Care | Payer: Self-pay | Attending: Emergency Medicine | Admitting: Emergency Medicine

## 2024-06-03 ENCOUNTER — Other Ambulatory Visit: Payer: Self-pay

## 2024-06-03 ENCOUNTER — Encounter (HOSPITAL_BASED_OUTPATIENT_CLINIC_OR_DEPARTMENT_OTHER): Payer: Self-pay

## 2024-06-03 DIAGNOSIS — S63501A Unspecified sprain of right wrist, initial encounter: Secondary | ICD-10-CM | POA: Insufficient documentation

## 2024-06-03 DIAGNOSIS — Y990 Civilian activity done for income or pay: Secondary | ICD-10-CM | POA: Diagnosis not present

## 2024-06-03 DIAGNOSIS — W1839XA Other fall on same level, initial encounter: Secondary | ICD-10-CM | POA: Insufficient documentation

## 2024-06-03 DIAGNOSIS — S6991XA Unspecified injury of right wrist, hand and finger(s), initial encounter: Secondary | ICD-10-CM | POA: Diagnosis present

## 2024-06-03 NOTE — ED Provider Notes (Signed)
 [place] EMERGENCY DEPARTMENT AT [facility] Provider Note   CSN: [id number] Arrival date & time: 06/03/24  1954     Patient presents with: Wrist Pain   Jenna Montoya is a 53 y.o. female.   Patient is a 53 year old female presenting with right wrist pain.  She reports falling at work on an outstretched hand.  She has pain and swelling to the right wrist along with pain with range of motion.       Prior to Admission medications   Medication Sig Start Date End Date Taking? Authorizing Provider  Ascorbic Acid (VITAMIN C) 1000 MG tablet Take 1,000 mg by mouth daily.    [provider]  calcium carbonate (OS-CAL) 600 MG TABS Take 600 mg by mouth 2 (two) times daily with a meal.    [provider]  cholecalciferol (VITAMIN D ) 1000 UNITS tablet Take 1,000 Units by mouth daily.    [provider]  denosumab  (PROLIA ) 60 MG/ML SOSY injection Inject 60 mg into the skin every 6 (six) months.    [provider]  Estradiol  (VAGIFEM ) 10 MCG TABS vaginal tablet Place 1 tablet (10 mcg total) vaginally 2 (two) times a week. Use every night before bed for two weeks when you first begin this medicine, then after the first two weeks, begin using it twice a week. 06/29/23   Amundson C Silva, Brook E, MD  famotidine (PEPCID) 20 MG tablet Take 20 mg by mouth 2 (two) times daily as needed. 04/25/22   [provider]  ibuprofen  (ADVIL ) 600 MG tablet Take 1 tablet (600 mg total) by mouth every 6 (six) hours as needed. 04/30/21   Elnor Savant A, DO  IRON PO Take 1 tablet by mouth daily.    [provider]  MAGNESIUM PO Take by mouth.    [provider]  Omega-3 1000 MG CAPS Take by mouth.    [provider]  ondansetron  (ZOFRAN -ODT) 8 MG disintegrating tablet PLACE 1 TABLET ON TONGUE EVERY 8 HOURS AS NEEDED    [provider]  Probiotic Product (PROBIOTIC-10 PO) Take by mouth.    [provider]    Allergies:  Hydrocodone , Metronidazole , and Methocarbamol     Review of Systems  All other systems reviewed and are negative.   Updated Vital Signs BP 127/66   Pulse 77   Temp 98.8 F (37.1 C) (Oral)   Resp 16   LMP 02/09/2005   SpO2 99%   Physical Exam Vitals and nursing note reviewed.  Constitutional:      Appearance: Normal appearance.  Pulmonary:     Effort: Pulmonary effort is normal.  Musculoskeletal:     Comments: The right wrist is grossly normal in appearance, but does have some swelling over the area of the distal radius.  She has pain with range of motion.  She is able to flex, extend, oppose all fingers and sensation is intact throughout all fingers.  Skin:    General: Skin is warm and dry.  Neurological:     Mental Status: She is alert and oriented to person, place, and time.     (all labs ordered are listed, but only abnormal results are displayed) Labs Reviewed - No data to display  EKG: None  Radiology: DG Wrist Complete Right Result Date: 06/03/2024 CLINICAL DATA:  Clemens, wrist injury EXAM: RIGHT WRIST - COMPLETE 3+ VIEW COMPARISON:  None Available. FINDINGS: Frontal, oblique, lateral, and ulnar deviated views of the right wrist are obtained. No acute  fracture, subluxation, or dislocation. Joint spaces are well preserved. Mild dorsal soft tissue swelling. IMPRESSION: 1. Mild soft tissue swelling.  No acute displaced fracture. Electronically Signed   By: Ozell Daring M.D.   On: 06/03/2024 21:10     Procedures   Medications Ordered in the ED - No data to display                                  Medical Decision Making Amount and/or Complexity of Data Reviewed Radiology: ordered.   X-rays are negative for fracture.  This to be treated as a sprain with a Velcro wrist splint.  She is to take ibuprofen , ice, rest, and follow-up as needed if not improving in the next 1 to 2 weeks.     Final diagnoses:  None    ED Discharge Orders     None           Geroldine Berg, MD 06/03/24 [id number]

## 2024-06-03 NOTE — ED Triage Notes (Signed)
 Pt presents via POV c/o wrist injury after falling and trying to catch herself. Reports mechanical fall. Denies LOC. A&O x4. Ambulatory to triage.

## 2024-06-03 NOTE — Discharge Instructions (Signed)
 Wear wrist splint for comfort and support.  Ice for 20 minutes every 2 hours while awake for the next 2 days.  Take ibuprofen  400 mg every 6 hours as needed for pain.  Follow-up with primary doctor if not improving in the next 1 to 2 weeks.

## 2024-07-29 ENCOUNTER — Encounter: Payer: Self-pay | Admitting: Radiology

## 2025-02-06 ENCOUNTER — Ambulatory Visit: Admitting: Obstetrics and Gynecology
